# Patient Record
Sex: Female | Born: 1950 | Race: Black or African American | Hispanic: No | Marital: Married | Smoking: Never smoker
Health system: Southern US, Community
[De-identification: ages and names within clinical notes are randomized; demographics above are authoritative.]

## PROBLEM LIST (undated history)

## (undated) DIAGNOSIS — I10 Essential (primary) hypertension: Secondary | ICD-10-CM

## (undated) DIAGNOSIS — F419 Anxiety disorder, unspecified: Secondary | ICD-10-CM

## (undated) DIAGNOSIS — R011 Cardiac murmur, unspecified: Secondary | ICD-10-CM

## (undated) DIAGNOSIS — H269 Unspecified cataract: Secondary | ICD-10-CM

## (undated) DIAGNOSIS — M1990 Unspecified osteoarthritis, unspecified site: Secondary | ICD-10-CM

## (undated) DIAGNOSIS — T7840XA Allergy, unspecified, initial encounter: Secondary | ICD-10-CM

## (undated) HISTORY — DX: Unspecified osteoarthritis, unspecified site: M19.90

## (undated) HISTORY — DX: Allergy, unspecified, initial encounter: T78.40XA

## (undated) HISTORY — DX: Unspecified cataract: H26.9

## (undated) HISTORY — DX: Cardiac murmur, unspecified: R01.1

## (undated) HISTORY — PX: HAND SURGERY: SHX662

---

## 1999-10-07 ENCOUNTER — Ambulatory Visit (HOSPITAL_COMMUNITY): Admission: RE | Admit: 1999-10-07 | Discharge: 1999-10-08 | Payer: Self-pay | Admitting: Cardiovascular Disease

## 2000-01-04 ENCOUNTER — Other Ambulatory Visit: Admission: RE | Admit: 2000-01-04 | Discharge: 2000-01-04 | Payer: Self-pay | Admitting: Gynecology

## 2001-01-08 ENCOUNTER — Other Ambulatory Visit: Admission: RE | Admit: 2001-01-08 | Discharge: 2001-01-08 | Payer: Self-pay | Admitting: Gynecology

## 2001-02-14 ENCOUNTER — Encounter: Admission: RE | Admit: 2001-02-14 | Discharge: 2001-02-14 | Payer: Self-pay | Admitting: Internal Medicine

## 2001-02-14 ENCOUNTER — Encounter: Payer: Self-pay | Admitting: Internal Medicine

## 2002-02-15 ENCOUNTER — Other Ambulatory Visit: Admission: RE | Admit: 2002-02-15 | Discharge: 2002-02-15 | Payer: Self-pay | Admitting: Gynecology

## 2002-02-21 ENCOUNTER — Encounter: Payer: Self-pay | Admitting: Internal Medicine

## 2002-02-21 ENCOUNTER — Encounter: Admission: RE | Admit: 2002-02-21 | Discharge: 2002-02-21 | Payer: Self-pay | Admitting: Internal Medicine

## 2002-03-01 ENCOUNTER — Ambulatory Visit (HOSPITAL_COMMUNITY): Admission: RE | Admit: 2002-03-01 | Discharge: 2002-03-01 | Payer: Self-pay | Admitting: Gastroenterology

## 2002-03-15 ENCOUNTER — Other Ambulatory Visit: Admission: RE | Admit: 2002-03-15 | Discharge: 2002-03-15 | Payer: Self-pay | Admitting: *Deleted

## 2002-11-01 ENCOUNTER — Encounter: Admission: RE | Admit: 2002-11-01 | Discharge: 2002-11-01 | Payer: Self-pay | Admitting: *Deleted

## 2003-02-17 ENCOUNTER — Other Ambulatory Visit: Admission: RE | Admit: 2003-02-17 | Discharge: 2003-02-17 | Payer: Self-pay | Admitting: Gynecology

## 2004-01-09 ENCOUNTER — Encounter: Admission: RE | Admit: 2004-01-09 | Discharge: 2004-01-09 | Payer: Self-pay | Admitting: Internal Medicine

## 2004-03-10 ENCOUNTER — Other Ambulatory Visit: Admission: RE | Admit: 2004-03-10 | Discharge: 2004-03-10 | Payer: Self-pay | Admitting: Gynecology

## 2005-05-18 ENCOUNTER — Other Ambulatory Visit: Admission: RE | Admit: 2005-05-18 | Discharge: 2005-05-18 | Payer: Self-pay | Admitting: Gynecology

## 2006-05-24 ENCOUNTER — Other Ambulatory Visit: Admission: RE | Admit: 2006-05-24 | Discharge: 2006-05-24 | Payer: Self-pay | Admitting: Gynecology

## 2006-06-13 ENCOUNTER — Encounter: Admission: RE | Admit: 2006-06-13 | Discharge: 2006-06-13 | Payer: Self-pay | Admitting: Internal Medicine

## 2006-06-29 ENCOUNTER — Ambulatory Visit (HOSPITAL_BASED_OUTPATIENT_CLINIC_OR_DEPARTMENT_OTHER): Admission: RE | Admit: 2006-06-29 | Discharge: 2006-06-29 | Payer: Self-pay | Admitting: Gynecology

## 2006-06-29 ENCOUNTER — Encounter (INDEPENDENT_AMBULATORY_CARE_PROVIDER_SITE_OTHER): Payer: Self-pay | Admitting: Specialist

## 2007-08-15 ENCOUNTER — Encounter: Admission: RE | Admit: 2007-08-15 | Discharge: 2007-08-15 | Payer: Self-pay | Admitting: Internal Medicine

## 2010-11-12 ENCOUNTER — Other Ambulatory Visit: Payer: Self-pay | Admitting: Gynecology

## 2010-12-17 NOTE — Cardiovascular Report (Signed)
[place]. [facility]  Patient:    Brittany Collier, Brittany Collier                         MRN: [id number] Proc. Date: 10/07/99 Adm. Date:  [id number] Disc. Date: [id number] Attending:  Berry, Jonathan [place] CC:         [facility], [address] [place]             Cardiac Catheterization Laboratory             Warrick Parisian, M.D.                        Cardiac Catheterization  INDICATIONS:  Brittany Collier is a 60 year old African-American female, patient of Dr. Traci Sermon, referred for evaluation of chest pain.  She apparently had a GXT performed recently which was positive.  A Cardiolite stress test performed in our office revealed mild anterolateral ischemia versus breast attenuation.  Her complaints are chest burning returning with stress but not exertion, occasionally goes into her left arm and is associated with lightheadedness and palpitations.  The patient presents now for diagnostic coronary arteriography.  DESCRIPTION OF PROCEDURE:  The patient was brought to the second floor [facility] in the postabsorptive state.  After she was premedicated with p.o. Valium and IV Versed, her right groin was prepped and shaved in the usual sterile fashion.  Xylocaine 1% was used for local anesthesia. A 6 French sheath was inserted into the right femoral artery using standard Seldinger technique.  A 6 French right and left Judkins diagnostic along with a 6 French pigtail catheter were used for selective coronary angiography, left ventriculography, and distal abdominal aortography.  Omnipaque dye was used for the entirety of the case.   Retrograde, aortic, left ventricular and pullback pressures were recorded.  HEMODYNAMICS: 1. Aortic systolic pressure 111, diastolic pressure 71. 2. Left ventricular systolic pressure 111, diastolic pressure 14.  SELECTIVE CORONARY ANGIOGRAPHY: 1. Left main:  Normal. 2. Left anterior  descending:  The LAD is normal. 3. Left circumflex:  Normal. 4. Ramus intermedius branch:  Normal. 5. Right coronary artery:  Codominant and normal.  LEFT VENTRICULOGRAPHY:  The RAO left ventriculogram was performed using 20 cc of Omnipaque dye, 10 cc per second.  The overall LV ejection fraction was estimated aT greater than 70% with ______ obliteration.  DISTAL ABDOMINAL AORTOGRAPHY: Distal abdominal aortogram was performed using 30 cc of Omnipaque dye, 20 cc per second.  The renal arteries are widely patent.  The  infrarenal abdominal aorta and iliac bifurcation were free of significant atherosclerotic changes.  IMPRESSION:  Brittany Collier has normal coronary arteries, normal left ventricular function.  I suspect the pain is noncardiac, most likely gastrointestinal or stress related.  She had a false but minimally positive Cardiolite stress test.  The sheaths were removed and pressure was held on the groin to achieve hemostasis. The patient left the lab in stable condition.  She will remain in the hospital overnight because of the lateness of the hour and be hydrated.  She will be discharged home in the morning and will follow up with Dr. Norina Buzzard for further evaluation.  Dr. Traci Sermon is notified of these results. DD:  10/07/99 TD:  10/09/99 Job: [id number]

## 2010-12-17 NOTE — Op Note (Signed)
   NAME:  Brittany Collier, Brittany Collier                            ACCOUNT NO.:  [id number]   MEDICAL RECORD NO.:  [id number]                   PATIENT TYPE:  AMB   LOCATION:  ENDO                                 FACILITY:  [facility]   PHYSICIAN:  Charolett Bumpers, M.D.             DATE OF BIRTH:  [birth year]   DATE OF PROCEDURE:  03/01/2002  DATE OF DISCHARGE:  03/01/2002                                 OPERATIVE REPORT   PROCEDURE:  Colonoscopy.   INDICATIONS FOR PROCEDURE:  Ms. Brittany Collier is a 60 year old female born  11-12-2050.  Ms. Brittany Collier continues to have regular menses on a monthly basis.  She  has unexplained iron-deficiency anemia and was referred for screening  colonoscopy with polypectomy to prevent colon cancer.   I discussed with Ms. Brittany Collier the complications associated with colonoscopy and  polypectomy, including a 15 per thousand risk of bleeding and four per  thousand risk of colon perforation requiring surgical repair.  Ms. Brittany Collier has  signed the permit.   ENDOSCOPIST:  Charolett Bumpers, M.D.   PREMEDICATION:  Versed 10 mg, fentanyl 50 mcg.   SCOPE:  Olympus pediatric colonoscopy.   DESCRIPTION OF PROCEDURE:  After obtaining informed consent, Ms. Brittany Collier was  placed in the left lateral decubitus position.  I administered intravenous  fentanyl and intravenous Versed to achieve conscious sedation for the  procedure.  The patient's blood pressure, oxygen saturation, and cardiac  rhythm were monitored throughout the procedure and documented in the medical  record.   Anal inspection was normal.  Digital rectal exam was normal.  The Olympus  pediatric video colonoscope was introduced into the rectum and advanced to  the cecum.  Colonic preparation for the exam today was excellent.  Ms. Brittany Collier  received the Miralax-Gatorade colonic lavage prep.   Rectum normal.   Sigmoid colon and descending colon normal.   Splenic flexure normal.   Transverse colon normal.   Hepatic flexure normal.   Ascending colon normal.   Cecum and ileocecal valve  normal.    ASSESSMENT:  Normal proctocolonoscopy to the cecum.  No endoscopic evidence  for the presence of colorectal neoplasia.                                                 Charolett Bumpers, M.D.    MKJ/MEDQ  D:  03/01/2002  T:  03/06/2002  Job:  [id number]   cc:   Georgann Housekeeper, M.D.

## 2010-12-17 NOTE — Op Note (Signed)
NAME:  Brittany Collier, Brittany Collier                  ACCOUNT NO.:  [id number]   MEDICAL RECORD NO.:  [id number]          PATIENT TYPE:  AMB   LOCATION:  [facility]                         FACILITY:  [facility]   PHYSICIAN:  Gretta Cool, M.D. DATE OF BIRTH:  01-19-1951   DATE OF PROCEDURE:  06/29/2006  DATE OF DISCHARGE:                               OPERATIVE REPORT   PREOPERATIVE DIAGNOSIS:  Endometrial polyps.   POSTOPERATIVE DIAGNOSIS:  1. Endometrial polyps.  2. Submucous fibroid.   OPERATION/PROCEDURE:  Resection of endometrial polyps and submucous  fibroid, anterior uterine wall.   SURGEON:  Gretta Cool, M.D.   ANESTHESIA:  IV sedation and paracervical block.   DESCRIPTION OF PROCEDURE:  Under excellent anesthesia as above, with the  patient prepped and draped in the Allen stirrups, with her bladder  drained, a weighted speculum was placed in the vagina posteriorly.  Then  the cervix was then pulled down into view and anesthetized as above.  The cervical canal was then progressively dilated with a series of Pratt  dilators to accommodate the 7 mm resectoscope.  At this point, the  cavity was photographed and a large sessile anterior wall fibroid was  identified.  The fibroid was progressively resected down at least 5 mm  into the myometrium until no further gland openings could be identified.  Once the polyp was resected, a fibroid was noted to have been partially  resected with the resection of the polyp.  That fibroid was completely  enucleated from the capsule and completely removed with manipulation by  the 90-degree loop and by cautery with the loop.  At the end of the  procedure, there was no significant bleeding at reduced pressure.  The  fluid deficit was 90 mL.  No complications.  No other endometrial or  uterine pathology was identified.  The procedure was then terminated  without complications and without ablation of the remainder of the  endometrium at the patient's  request.   At this point, the patient returned to the recovery room in excellent  condition.           ______________________________  Gretta Cool, M.D.     CWL/MEDQ  D:  06/29/2006  T:  06/30/2006  Job:  [id number]   cc:   Georgann Housekeeper, MD  Fax: [phone]

## 2011-08-24 ENCOUNTER — Other Ambulatory Visit: Payer: Self-pay | Admitting: Internal Medicine

## 2011-08-24 DIAGNOSIS — Z1231 Encounter for screening mammogram for malignant neoplasm of breast: Secondary | ICD-10-CM

## 2011-09-05 ENCOUNTER — Ambulatory Visit
Admission: RE | Admit: 2011-09-05 | Discharge: 2011-09-05 | Disposition: A | Discharge status: Home / Self Care | Payer: BC Managed Care – PPO | Source: Ambulatory Visit | Attending: Internal Medicine | Admitting: Internal Medicine

## 2011-09-05 DIAGNOSIS — Z1231 Encounter for screening mammogram for malignant neoplasm of breast: Secondary | ICD-10-CM

## 2011-12-05 ENCOUNTER — Other Ambulatory Visit: Payer: Self-pay | Admitting: Gynecology

## 2012-12-06 ENCOUNTER — Other Ambulatory Visit: Payer: Self-pay | Admitting: Internal Medicine

## 2012-12-06 DIAGNOSIS — Z1231 Encounter for screening mammogram for malignant neoplasm of breast: Secondary | ICD-10-CM

## 2012-12-20 ENCOUNTER — Other Ambulatory Visit: Payer: Self-pay | Admitting: Obstetrics and Gynecology

## 2012-12-20 ENCOUNTER — Other Ambulatory Visit (HOSPITAL_COMMUNITY)
Admission: RE | Admit: 2012-12-20 | Discharge: 2012-12-20 | Disposition: A | Discharge status: Home / Self Care | Payer: BC Managed Care – PPO | Source: Ambulatory Visit | Attending: Obstetrics and Gynecology | Admitting: Obstetrics and Gynecology

## 2012-12-20 DIAGNOSIS — Z01419 Encounter for gynecological examination (general) (routine) without abnormal findings: Secondary | ICD-10-CM | POA: Insufficient documentation

## 2012-12-20 DIAGNOSIS — Z1151 Encounter for screening for human papillomavirus (HPV): Secondary | ICD-10-CM | POA: Insufficient documentation

## 2012-12-31 ENCOUNTER — Other Ambulatory Visit: Payer: Self-pay | Admitting: Obstetrics and Gynecology

## 2013-01-11 ENCOUNTER — Inpatient Hospital Stay: Admission: RE | Admit: 2013-01-11 | Payer: BC Managed Care – PPO | Source: Ambulatory Visit

## 2013-12-31 ENCOUNTER — Other Ambulatory Visit (HOSPITAL_COMMUNITY)
Admission: RE | Admit: 2013-12-31 | Discharge: 2013-12-31 | Disposition: A | Discharge status: Home / Self Care | Payer: BC Managed Care – PPO | Source: Ambulatory Visit | Attending: Obstetrics and Gynecology | Admitting: Obstetrics and Gynecology

## 2013-12-31 ENCOUNTER — Other Ambulatory Visit: Payer: Self-pay | Admitting: Obstetrics and Gynecology

## 2013-12-31 DIAGNOSIS — Z01419 Encounter for gynecological examination (general) (routine) without abnormal findings: Secondary | ICD-10-CM | POA: Insufficient documentation

## 2014-01-02 ENCOUNTER — Other Ambulatory Visit: Payer: Self-pay

## 2014-01-02 DIAGNOSIS — Z1231 Encounter for screening mammogram for malignant neoplasm of breast: Secondary | ICD-10-CM

## 2014-01-03 LAB — CYTOLOGY - PAP

## 2014-01-20 ENCOUNTER — Ambulatory Visit
Admission: RE | Admit: 2014-01-20 | Discharge: 2014-01-20 | Disposition: A | Discharge status: Home / Self Care | Payer: BC Managed Care – PPO | Source: Ambulatory Visit

## 2014-01-20 DIAGNOSIS — Z1231 Encounter for screening mammogram for malignant neoplasm of breast: Secondary | ICD-10-CM

## 2014-11-07 ENCOUNTER — Emergency Department (HOSPITAL_COMMUNITY)
Admission: EM | Admit: 2014-11-07 | Discharge: 2014-11-07 | Disposition: A | Discharge status: Home / Self Care | Payer: Medicare Other | Attending: Emergency Medicine | Admitting: Emergency Medicine

## 2014-11-07 ENCOUNTER — Encounter (HOSPITAL_COMMUNITY): Payer: Self-pay | Admitting: Emergency Medicine

## 2014-11-07 ENCOUNTER — Emergency Department (HOSPITAL_COMMUNITY): Payer: Medicare Other

## 2014-11-07 DIAGNOSIS — F1310 Sedative, hypnotic or anxiolytic abuse, uncomplicated: Secondary | ICD-10-CM | POA: Insufficient documentation

## 2014-11-07 DIAGNOSIS — Z79899 Other long term (current) drug therapy: Secondary | ICD-10-CM | POA: Insufficient documentation

## 2014-11-07 DIAGNOSIS — I10 Essential (primary) hypertension: Secondary | ICD-10-CM | POA: Insufficient documentation

## 2014-11-07 DIAGNOSIS — R202 Paresthesia of skin: Secondary | ICD-10-CM | POA: Diagnosis not present

## 2014-11-07 DIAGNOSIS — E162 Hypoglycemia, unspecified: Secondary | ICD-10-CM | POA: Insufficient documentation

## 2014-11-07 DIAGNOSIS — F419 Anxiety disorder, unspecified: Secondary | ICD-10-CM | POA: Insufficient documentation

## 2014-11-07 DIAGNOSIS — R200 Anesthesia of skin: Secondary | ICD-10-CM | POA: Diagnosis present

## 2014-11-07 HISTORY — DX: Essential (primary) hypertension: I10

## 2014-11-07 HISTORY — DX: Anxiety disorder, unspecified: F41.9

## 2014-11-07 LAB — DIFFERENTIAL
BASOS PCT: 0 % (ref 0–1)
Basophils Absolute: 0 10*3/uL (ref 0.0–0.1)
Eosinophils Absolute: 0.1 10*3/uL (ref 0.0–0.7)
Eosinophils Relative: 1 % (ref 0–5)
LYMPHS ABS: 1.8 10*3/uL (ref 0.7–4.0)
Lymphocytes Relative: 24 % (ref 12–46)
MONOS PCT: 6 % (ref 3–12)
Monocytes Absolute: 0.5 10*3/uL (ref 0.1–1.0)
NEUTROS ABS: 5.1 10*3/uL (ref 1.7–7.7)
NEUTROS PCT: 69 % (ref 43–77)

## 2014-11-07 LAB — I-STAT CHEM 8, ED
BUN: 19 mg/dL (ref 6–23)
Calcium, Ion: 1.17 mmol/L (ref 1.13–1.30)
Chloride: 93 mmol/L — ABNORMAL LOW (ref 96–112)
Creatinine, Ser: 1.2 mg/dL — ABNORMAL HIGH (ref 0.50–1.10)
GLUCOSE: 100 mg/dL — AB (ref 70–99)
HEMATOCRIT: 43 % (ref 36.0–46.0)
HEMOGLOBIN: 14.6 g/dL (ref 12.0–15.0)
POTASSIUM: 3 mmol/L — AB (ref 3.5–5.1)
Sodium: 130 mmol/L — ABNORMAL LOW (ref 135–145)
TCO2: 21 mmol/L (ref 0–100)

## 2014-11-07 LAB — URINALYSIS, ROUTINE W REFLEX MICROSCOPIC
BILIRUBIN URINE: NEGATIVE
GLUCOSE, UA: 100 mg/dL — AB
HGB URINE DIPSTICK: NEGATIVE
KETONES UR: 15 mg/dL — AB
LEUKOCYTES UA: NEGATIVE
NITRITE: NEGATIVE
PH: 7 (ref 5.0–8.0)
Protein, ur: NEGATIVE mg/dL
SPECIFIC GRAVITY, URINE: 1.008 (ref 1.005–1.030)
UROBILINOGEN UA: 0.2 mg/dL (ref 0.0–1.0)

## 2014-11-07 LAB — COMPREHENSIVE METABOLIC PANEL
ALK PHOS: 56 U/L (ref 39–117)
ALT: 24 U/L (ref 0–35)
ANION GAP: 15 (ref 5–15)
AST: 40 U/L — ABNORMAL HIGH (ref 0–37)
Albumin: 4.3 g/dL (ref 3.5–5.2)
BUN: 17 mg/dL (ref 6–23)
CALCIUM: 9.7 mg/dL (ref 8.4–10.5)
CO2: 23 mmol/L (ref 19–32)
CREATININE: 1.23 mg/dL — AB (ref 0.50–1.10)
Chloride: 91 mmol/L — ABNORMAL LOW (ref 96–112)
GFR calc non Af Amer: 45 mL/min — ABNORMAL LOW (ref >90)
GFR, EST AFRICAN AMERICAN: 52 mL/min — AB (ref >90)
GLUCOSE: 101 mg/dL — AB (ref 70–99)
Potassium: 3 mmol/L — ABNORMAL LOW (ref 3.5–5.1)
Sodium: 129 mmol/L — ABNORMAL LOW (ref 135–145)
TOTAL PROTEIN: 7.4 g/dL (ref 6.0–8.3)
Total Bilirubin: 1.1 mg/dL (ref 0.3–1.2)

## 2014-11-07 LAB — RAPID URINE DRUG SCREEN, HOSP PERFORMED
Amphetamines: NOT DETECTED
BARBITURATES: NOT DETECTED
Benzodiazepines: POSITIVE — AB
COCAINE: NOT DETECTED
Opiates: NOT DETECTED
Tetrahydrocannabinol: NOT DETECTED

## 2014-11-07 LAB — CBC
HEMATOCRIT: 36.9 % (ref 36.0–46.0)
HEMOGLOBIN: 13.4 g/dL (ref 12.0–15.0)
MCH: 32.8 pg (ref 26.0–34.0)
MCHC: 36.3 g/dL — AB (ref 30.0–36.0)
MCV: 90.4 fL (ref 78.0–100.0)
Platelets: 217 10*3/uL (ref 150–400)
RBC: 4.08 MIL/uL (ref 3.87–5.11)
RDW: 12.9 % (ref 11.5–15.5)
WBC: 7.4 10*3/uL (ref 4.0–10.5)

## 2014-11-07 LAB — PROTIME-INR
INR: 0.95 (ref 0.00–1.49)
PROTHROMBIN TIME: 12.8 s (ref 11.6–15.2)

## 2014-11-07 LAB — CBG MONITORING, ED
GLUCOSE-CAPILLARY: 93 mg/dL (ref 70–99)
Glucose-Capillary: 69 mg/dL — ABNORMAL LOW (ref 70–99)
Glucose-Capillary: 82 mg/dL (ref 70–99)

## 2014-11-07 LAB — I-STAT TROPONIN, ED: TROPONIN I, POC: 0.01 ng/mL (ref 0.00–0.08)

## 2014-11-07 LAB — ETHANOL: Alcohol, Ethyl (B): <5 mg/dL (ref 0–9)

## 2014-11-07 LAB — APTT: APTT: 28 s (ref 24–37)

## 2014-11-07 MED ORDER — DEXTROSE 50 % IV SOLN
INTRAVENOUS | Status: AC
  Administered 2014-11-07: 25 mL via INTRAVENOUS
  Filled 2014-11-07: qty 50

## 2014-11-07 MED ORDER — SODIUM CHLORIDE 0.9 % IV BOLUS (SEPSIS)
1000.0000 mL | Freq: Once | INTRAVENOUS | Status: AC
  Administered 2014-11-07: 1000 mL via INTRAVENOUS

## 2014-11-07 MED ORDER — DEXTROSE 50 % IV SOLN
25.0000 mL | Freq: Once | INTRAVENOUS | Status: AC
Start: 2014-11-07 — End: 2014-11-07
  Administered 2014-11-07: 25 mL via INTRAVENOUS

## 2014-11-07 NOTE — Code Documentation (Signed)
[facility] after developing Lt forearm tingling, facial droop, and slurred speech.  She has recently been taking Clindamycin for a tooth "issue" and has been taking 2tabs q4hrs instead of qid.  On assessment she was found to have only tingling to the Lt forearm & a CBG 69.  She was given 1/2 amp D50 and the symptoms of tingling in her Lt forearm resolved.  NIH1 resolved to NIH 0.  Plan to be managed by EDP.

## 2014-11-07 NOTE — ED Notes (Signed)
CARELINK CALLED @2030 /CODE STROKE ACTIVATED.

## 2014-11-07 NOTE — ED Notes (Signed)
The pt is c/o lt arm numbness for one hour with blurred vision for 30 minutes.  Both hands numb feeling dizzy.  She is taking clindamycin and she thinks this med is causing her symptoms. Both hands and feet are cold

## 2014-11-07 NOTE — Discharge Instructions (Signed)
Take ASA 81 mg daily.   See neurologist if you still have numbness.  Your sugar is low. Please eat and drink normally and stay hydrated.   Follow up with your doctor.   Return to ER if you have worse numbness, weakness, trouble speaking.

## 2014-11-07 NOTE — ED Notes (Signed)
Dr Blinda Leatherwoodpollina saw the pt in triage called code stroke

## 2014-11-07 NOTE — Consult Note (Signed)
Stroke Consult    Chief Complaint: left forearm paresthesias HPI: Brittany Collier is an 64 y.o. female with history of HTN, anxiety presenting with acute onset of left forearm paresthesias described as a tingling sensation. Symptoms started at 1930 and is localized to left forearm from elbow to wrist, in no dermatomal distribution. Denies any weakness, no speech difficulties. Does note some dizziness and blurred vision. She notes that she recently started clindamycin and may have taken too many. After counting her pills it appears she has been taking an extra 2 tablets per day.  CT head imaging reviewed and overall. CBG 69 in the ED. Given 1/2 amp of D50 with resolution of LUE paresthesias.   Date last known well: 11/07/2014 Time last known well: 1930 tPA Given: no, NIH of 1, symptoms not fully consistent with a stroke  Past Medical History  Diagnosis Date  . Hypertension   . Anxiety     History reviewed. No pertinent past surgical history.  No family history on file. Social History:  has no tobacco, alcohol, and drug history on file.  Allergies: No Known Allergies   (Not in a hospital admission)  ROS: Out of a complete 14 system review, the patient complains of only the following symptoms, and all other reviewed systems are negative. +paresthesias, dizziness  Physical Examination: Filed Vitals:   11/07/14 2028  BP: 179/94  Pulse: 66  Temp: 97.9 F (36.6 C)  Resp: 18   Physical Exam  Constitutional: He appears well-developed and well-nourished.  Psych: Affect appropriate to situation Eyes: No scleral injection HENT: No OP obstrucion Head: Normocephalic.  Cardiovascular: Normal rate and regular rhythm.  Respiratory: Effort normal and breath sounds normal.  GI: Soft. Bowel sounds are normal. No distension. There is no tenderness.  Skin: WDI   Neurologic Examination: Mental Status: Alert, oriented, thought content appropriate.  Speech fluent without evidence of  aphasia.  Able to follow 3 step commands without difficulty. Cranial Nerves: II: funduscopic exam wnl bilaterally, visual fields grossly normal, pupils equal, round, reactive to light and accommodation III,IV, VI: ptosis not present, extra-ocular motions intact bilaterally V,VII: smile symmetric, facial light touch sensation normal bilaterally VIII: hearing normal bilaterally IX,X: gag reflex present XI: trapezius strength/neck flexion strength normal bilaterally XII: tongue strength normal  Motor: Right : Upper extremity    Left:     Upper extremity 5/5 deltoid       5/5 deltoid 5/5 biceps      5/5 biceps  5/5 triceps      5/5 triceps 5/5 hand grip      5/5 hand grip  Lower extremity     Lower extremity 5/5 hip flexor      5/5 hip flexor 5/5 quadricep      5/5 quadriceps  5/5 hamstrings     5/5 hamstrings 5/5 plantar flexion       5/5 plantar flexion 5/5 plantar extension     5/5 plantar extension Tone and bulk:normal tone throughout; no atrophy noted Sensory: Pinprick and light touch intact throughout, bilaterally Deep Tendon Reflexes: 2+ and symmetric throughout Plantars: Right: downgoing   Left: downgoing Cerebellar: normal finger-to-nose,  and normal heel-to-shin test Gait: deferred  Laboratory Studies:   Basic Metabolic Panel:  Recent Labs Lab 11/07/14 2044  NA 130*  K 3.0*  CL 93*  GLUCOSE 100*  BUN 19  CREATININE 1.20*    Liver Function Tests: No results for input(s): AST, ALT, ALKPHOS, BILITOT, PROT, ALBUMIN in the last 168 hours.  No results for input(s): LIPASE, AMYLASE in the last 168 hours. No results for input(s): AMMONIA in the last 168 hours.  CBC:  Recent Labs Lab 11/07/14 2044  HGB 14.6  HCT 43.0    Cardiac Enzymes: No results for input(s): CKTOTAL, CKMB, CKMBINDEX, TROPONINI in the last 168 hours.  BNP: Invalid input(s): POCBNP  CBG:  Recent Labs Lab 11/07/14 2046  GLUCAP 69*    Microbiology: No results found for this or any  previous visit.  Coagulation Studies: No results for input(s): LABPROT, INR in the last 72 hours.  Urinalysis: No results for input(s): COLORURINE, LABSPEC, PHURINE, GLUCOSEU, HGBUR, BILIRUBINUR, KETONESUR, PROTEINUR, UROBILINOGEN, NITRITE, LEUKOCYTESUR in the last 168 hours.  Invalid input(s): APPERANCEUR  Lipid Panel:  No results found for: CHOL, TRIG, HDL, CHOLHDL, VLDL, LDLCALC  HgbA1C: No results found for: HGBA1C  Urine Drug Screen:  No results found for: LABOPIA, COCAINSCRNUR, LABBENZ, AMPHETMU, THCU, LABBARB  Alcohol Level: No results for input(s): ETH in the last 168 hours.  Other results:  Imaging: Ct Head Wo Contrast  11/07/2014   CLINICAL DATA:  Code stroke. Left arm numbness. Blurred vision. Numbness in both hands. Dizziness.  EXAM: CT HEAD WITHOUT CONTRAST  TECHNIQUE: Contiguous axial images were obtained from the base of the skull through the vertex without intravenous contrast.  COMPARISON:  Grid  FINDINGS: No acute cortical infarct, hemorrhage, or mass lesion is present. The ventricles are of normal size. No significant extra-axial fluid collection is evident. The paranasal sinuses and mastoid air cells are clear. The calvarium is intact.  IMPRESSION: Negative CT of the head.  These results were called by telephone at the time of interpretation on 11/07/2014 at 8:45 pm to Dr. Hosie PoissonSumner, who verbally acknowledged these results.   Electronically Signed   By: Marin Robertshristopher  Mattern M.D.   On: 11/07/2014 20:45    Assessment: 64 y.o. female with history of HTN presenting for acute onset of left forearm paresthesias. History pertinent for patient overmedicating on her clindamycin. CT head unremarkable. CBG initially 69 in ED, symptoms resolved with 1/2 amp of D50. Initial NIHSS of 1. No tPA given for mildness of symptoms.   -no further neurological workup indicated at this time as suspect symptoms related to low CBG or polypharmacy.  -Is within tPA window until midnight tonight, will  reassess if symptoms return -suggest starting daily ASA 81mg     Elspeth Choeter Keyaria Lawson, DO [facility] [phone]  If 7pm- 7am, please page neurology on call as listed in AMION. 11/07/2014, 8:55 PM

## 2014-11-07 NOTE — ED Notes (Signed)
The husband reports that  The pt has anxiety and takes med for that.  She also takes a water pill

## 2014-11-07 NOTE — ED Provider Notes (Signed)
CSN: [id number]     Arrival date & time 11/07/14  2014 History   First MD Initiated Contact with Patient 11/07/14 2047     Chief Complaint  Patient presents with  . Numbness    @EDPCLEARED @ (Consider location/radiation/quality/duration/timing/severity/associated sxs/prior Treatment) The history is provided by the patient.  Brittany Collier is a 64 y.o. female hx of HTN, anxiety here with L arm parethesia. Patient has history of anxiety has been taking clindamycin for her toothache. She did take an extra dose. She started having left arm paresthesias around 7 PM today. Denies any weakness or trouble speaking. No history of stroke in the past and no chest pain.    Past Medical History  Diagnosis Date  . Hypertension   . Anxiety    History reviewed. No pertinent past surgical history. No family history on file. History  Substance Use Topics  . Smoking status: Never Smoker   . Smokeless tobacco: Not on file  . Alcohol Use: Yes   OB History    No data available     Review of Systems  Musculoskeletal:       L arm paresthesia   All other systems reviewed and are negative.     Allergies  Review of patient's allergies indicates no known allergies.  Home Medications   Prior to Admission medications   Medication Sig Start Date End Date Taking? Authorizing Provider  clindamycin (CLEOCIN) 150 MG capsule Take 300 mg by mouth 4 (four) times daily. 11/04/14  Yes Historical Provider, MD  hydrochlorothiazide (HYDRODIURIL) 25 MG tablet Take 25 mg by mouth daily. 09/17/14  Yes Historical Provider, MD   BP 138/83 mmHg  Pulse 55  Temp(Src) 97.9 F (36.6 C)  Resp 16  Ht 5\' 4"  (1.626 m)  Wt 150 lb (68.04 kg)  BMI 25.73 kg/m2  SpO2 100% Physical Exam  Constitutional: She is oriented to person, place, and time.  Anxious   HENT:  Head: Normocephalic.  Mouth/Throat: Oropharynx is clear and moist.  Eyes: Conjunctivae are normal. Pupils are equal, round, and reactive to light.  Neck:  Normal range of motion. Neck supple.  Cardiovascular: Normal rate and regular rhythm.   Pulmonary/Chest: Effort normal and breath sounds normal. No respiratory distress. She has no wheezes. She has no rales.  Abdominal: Soft. Bowel sounds are normal. She exhibits no distension. There is no tenderness. There is no rebound.  Musculoskeletal: Normal range of motion. She exhibits no edema or tenderness.  Neurological: She is alert and oriented to person, place, and time. No cranial nerve deficit. Coordination normal.  CN 2-12 intact. Nl finger to nose. Nl strength throughout. ? Slightly dec sensation L forearm.   Skin: Skin is warm and dry.  Psychiatric: She has a normal mood and affect. Her behavior is normal. Judgment and thought content normal.  Nursing note and vitals reviewed.   ED Course  Procedures (including critical care time) Labs Review Labs Reviewed  CBC - Abnormal; Notable for the following:    MCHC 36.3 (*)    All other components within normal limits  COMPREHENSIVE METABOLIC PANEL - Abnormal; Notable for the following:    Sodium 129 (*)    Potassium 3.0 (*)    Chloride 91 (*)    Glucose, Bld 101 (*)    Creatinine, Ser 1.23 (*)    AST 40 (*)    GFR calc non Af Amer 45 (*)    GFR calc Af Amer 52 (*)    All other components  within normal limits  URINE RAPID DRUG SCREEN (HOSP PERFORMED) - Abnormal; Notable for the following:    Benzodiazepines POSITIVE (*)    All other components within normal limits  URINALYSIS, ROUTINE W REFLEX MICROSCOPIC - Abnormal; Notable for the following:    Glucose, UA 100 (*)    Ketones, ur 15 (*)    All other components within normal limits  I-STAT CHEM 8, ED - Abnormal; Notable for the following:    Sodium 130 (*)    Potassium 3.0 (*)    Chloride 93 (*)    Creatinine, Ser 1.20 (*)    Glucose, Bld 100 (*)    All other components within normal limits  CBG MONITORING, ED - Abnormal; Notable for the following:    Glucose-Capillary 69 (*)     All other components within normal limits  ETHANOL  PROTIME-INR  APTT  DIFFERENTIAL  I-STAT TROPOININ, ED  I-STAT TROPOININ, ED  CBG MONITORING, ED  CBG MONITORING, ED    Imaging Review Ct Head Wo Contrast  11/07/2014   CLINICAL DATA:  Code stroke. Left arm numbness. Blurred vision. Numbness in both hands. Dizziness.  EXAM: CT HEAD WITHOUT CONTRAST  TECHNIQUE: Contiguous axial images were obtained from the base of the skull through the vertex without intravenous contrast.  COMPARISON:  Grid  FINDINGS: No acute cortical infarct, hemorrhage, or mass lesion is present. The ventricles are of normal size. No significant extra-axial fluid collection is evident. The paranasal sinuses and mastoid air cells are clear. The calvarium is intact.  IMPRESSION: Negative CT of the head.  These results were called by telephone at the time of interpretation on 11/07/2014 at 8:45 pm to Dr. Hosie Poisson, who verbally acknowledged these results.   Electronically Signed   By: Marin Roberts M.D.   On: 11/07/2014 20:45     EKG Interpretation   Date/Time:  Friday November 07 2014 20:48:29 EDT Ventricular Rate:  68 PR Interval:  197 QRS Duration: 81 QT Interval:  424 QTC Calculation: 451 R Axis:   -29 Text Interpretation:  Sinus rhythm Consider left ventricular hypertrophy  Baseline wander in lead(s) V1 No previous ECGs available Confirmed by Dacen Frayre   MD, Kile Kabler ([id number]) on 11/07/2014 9:24:40 PM      MDM   Final diagnoses:  None    Brittany Collier is a 64 y.o. female here with L arm numbness. I doubt stroke. Numbness only in the forearm and no other neurologic finding. She is hypoglycemia to 67 but didn't eat any dinner. After observing in the ED for several hours, CBG stable. Neuro saw patient since code stroke activated in triage. Doesn't think she has stroke and doesn't want stroke workup. Will dc home.      Richardean Canal, MD 11/07/14 [phone]

## 2015-01-30 ENCOUNTER — Other Ambulatory Visit: Payer: Self-pay | Admitting: Internal Medicine

## 2015-01-30 DIAGNOSIS — Z1231 Encounter for screening mammogram for malignant neoplasm of breast: Secondary | ICD-10-CM

## 2015-02-04 ENCOUNTER — Other Ambulatory Visit: Payer: Self-pay | Admitting: Obstetrics and Gynecology

## 2015-02-04 ENCOUNTER — Other Ambulatory Visit (HOSPITAL_COMMUNITY)
Admission: RE | Admit: 2015-02-04 | Discharge: 2015-02-04 | Disposition: A | Discharge status: Home / Self Care | Payer: Medicare Other | Source: Ambulatory Visit | Attending: Obstetrics and Gynecology | Admitting: Obstetrics and Gynecology

## 2015-02-04 DIAGNOSIS — Z124 Encounter for screening for malignant neoplasm of cervix: Secondary | ICD-10-CM | POA: Insufficient documentation

## 2015-02-05 LAB — CYTOLOGY - PAP

## 2015-02-12 ENCOUNTER — Ambulatory Visit: Payer: Medicare Other

## 2015-03-06 ENCOUNTER — Ambulatory Visit: Payer: Medicare Other

## 2016-02-16 ENCOUNTER — Ambulatory Visit
Admission: RE | Admit: 2016-02-16 | Discharge: 2016-02-16 | Disposition: A | Discharge status: Home / Self Care | Payer: Medicare Other | Source: Ambulatory Visit | Attending: Internal Medicine | Admitting: Internal Medicine

## 2016-02-16 DIAGNOSIS — Z1231 Encounter for screening mammogram for malignant neoplasm of breast: Secondary | ICD-10-CM

## 2017-01-27 ENCOUNTER — Other Ambulatory Visit: Payer: Self-pay

## 2017-01-27 ENCOUNTER — Emergency Department (HOSPITAL_COMMUNITY): Payer: Medicare Other

## 2017-01-27 ENCOUNTER — Encounter (HOSPITAL_COMMUNITY): Payer: Self-pay

## 2017-01-27 ENCOUNTER — Observation Stay (HOSPITAL_COMMUNITY)
Admission: EM | Admit: 2017-01-27 | Discharge: 2017-01-28 | Disposition: A | Discharge status: Home / Self Care | Payer: Medicare Other | Attending: Family Medicine | Admitting: Family Medicine

## 2017-01-27 DIAGNOSIS — R51 Headache: Secondary | ICD-10-CM | POA: Insufficient documentation

## 2017-01-27 DIAGNOSIS — R0789 Other chest pain: Secondary | ICD-10-CM | POA: Diagnosis present

## 2017-01-27 DIAGNOSIS — R079 Chest pain, unspecified: Secondary | ICD-10-CM | POA: Diagnosis present

## 2017-01-27 DIAGNOSIS — Z79899 Other long term (current) drug therapy: Secondary | ICD-10-CM | POA: Diagnosis not present

## 2017-01-27 DIAGNOSIS — I10 Essential (primary) hypertension: Secondary | ICD-10-CM | POA: Diagnosis not present

## 2017-01-27 DIAGNOSIS — I161 Hypertensive emergency: Principal | ICD-10-CM | POA: Insufficient documentation

## 2017-01-27 LAB — CBC
HEMATOCRIT: 40.1 % (ref 36.0–46.0)
Hemoglobin: 14 g/dL (ref 12.0–15.0)
MCH: 32.8 pg (ref 26.0–34.0)
MCHC: 34.9 g/dL (ref 30.0–36.0)
MCV: 93.9 fL (ref 78.0–100.0)
Platelets: 177 10*3/uL (ref 150–400)
RBC: 4.27 MIL/uL (ref 3.87–5.11)
RDW: 13.9 % (ref 11.5–15.5)
WBC: 4.1 10*3/uL (ref 4.0–10.5)

## 2017-01-27 LAB — BASIC METABOLIC PANEL
Anion gap: 8 (ref 5–15)
BUN: 17 mg/dL (ref 6–20)
CHLORIDE: 98 mmol/L — AB (ref 101–111)
CO2: 27 mmol/L (ref 22–32)
Calcium: 9.9 mg/dL (ref 8.9–10.3)
Creatinine, Ser: 1.12 mg/dL — ABNORMAL HIGH (ref 0.44–1.00)
GFR calc Af Amer: 58 mL/min — ABNORMAL LOW (ref >60)
GFR calc non Af Amer: 50 mL/min — ABNORMAL LOW (ref >60)
GLUCOSE: 103 mg/dL — AB (ref 65–99)
Potassium: 3.5 mmol/L (ref 3.5–5.1)
SODIUM: 133 mmol/L — AB (ref 135–145)

## 2017-01-27 LAB — I-STAT TROPONIN, ED
TROPONIN I, POC: 0.01 ng/mL (ref 0.00–0.08)
Troponin i, poc: 0 ng/mL (ref 0.00–0.08)

## 2017-01-27 LAB — CBG MONITORING, ED: Glucose-Capillary: 90 mg/dL (ref 65–99)

## 2017-01-27 MED ORDER — ONDANSETRON HCL 4 MG/2ML IJ SOLN
4.0000 mg | Freq: Once | INTRAMUSCULAR | Status: AC
  Administered 2017-01-27: 4 mg via INTRAVENOUS

## 2017-01-27 MED ORDER — IOPAMIDOL (ISOVUE-370) INJECTION 76%
INTRAVENOUS | Status: AC
  Administered 2017-01-27: 100 mL
  Filled 2017-01-27: qty 100

## 2017-01-27 MED ORDER — ONDANSETRON HCL 4 MG/2ML IJ SOLN
INTRAMUSCULAR | Status: AC
  Filled 2017-01-27: qty 2

## 2017-01-27 MED ORDER — HYDRALAZINE HCL 20 MG/ML IJ SOLN
10.0000 mg | Freq: Once | INTRAMUSCULAR | Status: AC
  Administered 2017-01-27: 10 mg via INTRAVENOUS
  Filled 2017-01-27: qty 1

## 2017-01-27 NOTE — ED Notes (Signed)
ED Resident at bedside.

## 2017-01-27 NOTE — ED Notes (Signed)
Informed pt that I would wait a little bit after nausea medication and then get her a [place] sandwich.

## 2017-01-27 NOTE — H&P (Signed)
Family Medicine [facility] Admission History and Physical Service Pager: [phone]  Patient name: Brittany Collier Medical record number: [id number] Date of birth: [birth year] Age: 66 y.o. Gender: female  Primary Care Provider: No primary care provider on file. Consultants: none Code Status: full  Chief Complaint: dizziness, fatigue, confusion  Assessment and Plan: Brittany Collier is a 66 y.o. female presenting with dizziness, fatigue, confusion. PMH is significant for anxiety and HTN.   Pre-Syncope- Multiple complaints of dizziness, fatigue, and confusion in addition to chest discomfort. Unclear if these are related. Unclear etiology for patient's complaints, could have a psychogenic component to them as patient endorses long history of anxiety with recent stressors. Patient hypertensive in ED which could be contributing to presentation. Glucose normal in ED. CT head negative, making intracranial cause of her dizziness and confusion less likely. CXR negative, CTA negative for PE. Could be secondary to dehydration, given that it occurred after working out at the gym. No new medications that could be contributing. Concern for valvular abnormality such as aortic stenosis, given her III/VI systolic heart murmur heard loudest in the RUSB. -place in observation, attending Dr. Deirdre Priest -monitor on telemetry -vitals per floor -orthostatic vitals -gentle hydration overnight -repeat AM CBC and BMP -obtain echocardiogram given her heart murmur -consider carotid dopplers, although no carotid bruits appreciated -check TSH -check fasting CBGs each morning, given patient's history of hypoglycemia -treat anxiety, chest discomfort, and HTN as below  Chest discomfort- Concern for ACS given EKG with new T wave inversions in II, III, avF, V4-V6. Trops negative x 2. Heart score 5. Could be musculoskeletal in etiology, although discomfort was not reproducible on palpation of the chest. -trend  troponins -AM EKG -patient may benefit from outpatient stress test  Hypertension- BP elevated in ED 187/105, 189/107, 185/92, required 2 doses of IV hydralazine 10 mg each with adequate response of BP but made patient feel dizzy and "spacy". Hypertensive urgency could be contributing to patient's vague presenting symptoms. Takes 25 mg HCTZ at home along with potassium supplement. -monitor BP closely -continue home HCTZ, k-dur -hydralazine PRN for systolic > 180 and diastolic > 110 -patient will likely need another BP agent added this stay  Anxiety- Per patient, long history of panic attacks. She is on xanax XR 1 mg BID and 0.5 BID on home med list. She takes Xanax 1mg  XR and 0.5mg  in the morning, then takes Xanax XR again around 7pm, will take last 0.5 mg prior to bed for sleep. Reports significant stress in her life recently around her daughter's divorce and custody issues of grandchildren.  -continue home regimen -patient would benefit from outpatient behavioral health consultation and therapy -would suggest SSRI in addition to home regimen  FEN/GI: heart healthy diet Prophylaxis: lovenox  Disposition: observe on telemetry for ACS rule out  History of Present Illness:  Brittany Collier is a 66 y.o. female presenting with dizziness, fatigue, confusion, intermittent headache and an episode of chest pain.   On Tuesday, patient went to the gym and ran on the treadmill and lift weights. When she got home, she was very confused. She also had headache over her temples, upset stomach, fatigue, left arm tingling, and decreased appetite. She states she has felt this way before and came to the ED. She had low sugar and was given sugar in the ED. On Tuesday when she was feeling bad, she drank some gatorade, which made her feel better. On Wednesday, she continued to feel dizzy, off balance, and  tired. On Thursday night, her left arm was tingling and her posterior neck hurt. She again had a headache which is  unusual for her. She endorsed chest pain left sided that radiated to her left shoulder as well as back pain around her shoulder blade. She did check her BP and it was normal. This morning, she had a hair appointment at the salon. She still had low energy and felt dizzy. She has not been eating well over the past couple of days. She has only been eating breakfast. She normally eats "1.5 meals per day". She describes the dizziness as "everything feels spacy and a long way away, like she is moving through space". She has checked her blood pressures a few times at home over the last few months. The highest BP she has seen was "150 over something". Per husband, patient seems like her normal self in the ED now.  In the ED, she received Hydralazine IV x 2. After the second injection, she started feeling very dizzy and nauseated. She was given Zofran.  Patient endorses a long history of anxiety that has recently been worse. She takes Xanax for anxiety. She also runs which provides relief, however for past 2-3 months she has not been getting the therapeutic effect from running. When asked about stressors in her life she reports her daughter is going through a divorce and custody battle that is very upsetting and stressful to the patient.   Review Of Systems: Per HPI with the following additions:   Review of Systems  HENT: Negative for congestion and sore throat.   Eyes: Negative for blurred vision and double vision.  Respiratory: Negative for shortness of breath.   Cardiovascular: Negative for chest pain.  Gastrointestinal: Negative for blood in stool, constipation, diarrhea and melena.  Genitourinary: Negative for dysuria and urgency.  Musculoskeletal: Positive for neck pain. Negative for falls.  Neurological: Positive for dizziness, weakness and headaches. Negative for speech change.    Patient Active Problem List   Diagnosis Date Noted  . Chest pain 01/27/2017    Past Medical History: Past Medical  History:  Diagnosis Date  . Anxiety   . Hypertension     Past Surgical History: History reviewed. No pertinent surgical history.  Social History: Social History  Substance Use Topics  . Smoking status: Never Smoker  . Smokeless tobacco: Not on file  . Alcohol use Yes   Additional social history: Lives at home with husband. Drinks 7 oz of white wine at night. Please also refer to relevant sections of EMR.  Family History: Mother- steroid-induced diabetes, PVD Brother- passed away from stomach cancer  Allergies and Medications: Allergies  Allergen Reactions  . Penicillins     "doesnt remember"   No current facility-administered medications on file prior to encounter.    Current Outpatient Prescriptions on File Prior to Encounter  Medication Sig Dispense Refill  . hydrochlorothiazide (HYDRODIURIL) 25 MG tablet Take 25 mg by mouth daily.  0  . clindamycin (CLEOCIN) 150 MG capsule Take 300 mg by mouth 4 (four) times daily.  0    Objective: BP (!) 141/80 (BP Location: Left Arm)   Pulse 79   Temp 99.7 F (37.6 C) (Oral)   Resp 19   Ht 5\' 4"  (1.626 m)   Wt 151 lb 6.4 oz (68.7 kg)   SpO2 98%   BMI 25.99 kg/m  Exam: General: pleasant lady, appears younger than stated age, laying in bed in NAD Eyes: PERRL, EOMI. No scleral icterus  or conjunctival pallor ENTM: moist mucous membranes, no erythema or discharge in oropharynx, symmetrical palate rise Neck: supple, non-tender, no LAD Cardiovascular: RRR. 3/6 systolic murmur best heard at right upper sternal border. +2 radial and DP pulses. Chest pain not reproducible with palpation of anterior chest wall Respiratory: CTAB. No increased WOB Gastrointestinal: soft, non-tender, non-distended. No organomegaly. +BS MSK: no edema or cyanosis, left shoulder TTP at [facility] joint Derm: no rashes or lesions noted over back, abdomen, extremities  Neuro: CN2-12 in tact, strength 5/5 in upper and lower extremities bilaterally, sensation intact  throughout, symmetrical reflexes at L4 bilaterally, normal finger-to-nose testing, normal heel-to-shin. Psych: reported mood is anxious, appropriate affect  Labs and Imaging: CBC BMET   Recent Labs Lab 01/27/17 1444  WBC 4.1  HGB 14.0  HCT 40.1  PLT 177    Recent Labs Lab 01/27/17 1444  NA 133*  K 3.5  CL 98*  CO2 27  BUN 17  CREATININE 1.12*  GLUCOSE 103*  CALCIUM 9.9     Dg Chest 2 View  Result Date: 01/27/2017 CLINICAL DATA:  Left arm tingling and heaviness. EXAM: CHEST  2 VIEW COMPARISON:  None. FINDINGS: The heart size and mediastinal contours are within normal limits. Both lungs are clear. The visualized skeletal structures are unremarkable. IMPRESSION: No active cardiopulmonary disease. Electronically Signed   By: Gerome Sam III M.D   On: 01/27/2017 15:28   Ct Head Wo Contrast  Result Date: 01/27/2017 CLINICAL DATA:  Dizziness, loss of appetite, headache and numbness in left arm for 3 days. Hypertension. EXAM: CT HEAD WITHOUT CONTRAST TECHNIQUE: Contiguous axial images were obtained from the base of the skull through the vertex without intravenous contrast. COMPARISON:  Head CT dated 11/07/2014. FINDINGS: Brain: Ventricles are normal in size and configuration. There is no mass, hemorrhage, edema or other evidence of acute parenchymal abnormality. No extra-axial hemorrhage. Vascular: No hyperdense vessel or unexpected calcification. Skull: Normal. Negative for fracture or focal lesion. Sinuses/Orbits: No acute finding. Other: None. IMPRESSION: Negative head CT.  No intracranial mass, hemorrhage or edema. Electronically Signed   By: Bary Richard M.D.   On: 01/27/2017 17:02   Ct Angio Chest Aorta W And/or Wo Contrast  Result Date: 01/27/2017 CLINICAL DATA:  Chest pain and hypertension. LEFT arm tingling for 3 days. Assess for dissection. EXAM: CT ANGIOGRAPHY CHEST WITH CONTRAST TECHNIQUE: Multidetector CT imaging of the chest was performed using the standard protocol  during bolus administration of intravenous contrast tailored for assessment of pulmonary artery's. Multiplanar CT image reconstructions and MIPs were obtained to evaluate the vascular anatomy. CONTRAST:  100 cc Isovue 370 COMPARISON:  Chest radiograph January 27, 2017 at 1522 hours FINDINGS: CARDIOVASCULAR: Adequate contrast opacification of the pulmonary artery's. Main pulmonary artery is not enlarged. No pulmonary arterial filling defects to the level of the subsegmental branches. Heart size is mildly enlarged, no right heart strain. No pericardial effusion. Thoracic aorta is normal course and caliber, unremarkable. No dissection flap, intramural hematoma or contrast extravasation. MEDIASTINUM/NODES: No lymphadenopathy by CT size criteria. LUNGS/PLEURA: Tracheobronchial tree is patent, no pneumothorax. No pleural effusion or focal consolidation. RIGHT middle lobe atelectasis/scarring. UPPER ABDOMEN: Included view of the abdomen is nonsuspicious. Transient hepatic attenuation difference LEFT lobe and segment 4 of the liver. MUSCULOSKELETAL: Visualized soft tissues and included osseous structures are nonacute. Fluid-filled nondistended esophagus. Review of the MIP images confirms the above findings. IMPRESSION: No acute pulmonary embolism or acute vascular process on this pulmonary angiographic phase CTA chest. Mild cardiomegaly.  No acute pulmonary process. Electronically Signed   By: Awilda Metro M.D.   On: 01/27/2017 20:40     Tillman Sers, DO 01/28/2017, 1:00 AM PGY-1, [facility] FPTS Intern pager: [phone], text pages welcome  FPTS Upper-Level Resident Addendum  I have independently interviewed and examined the patient. I have discussed the above with the original author and agree with their documentation. My edits for correction/addition/clarification are in blue. Please see also any attending notes.   Willadean , MD PGY-2, [facility] FPTS Service pager:  [phone] (text pages welcome through [employer]ION)

## 2017-01-27 NOTE — ED Notes (Signed)
Pt states she is feeling better since lying flat.

## 2017-01-27 NOTE — ED Notes (Addendum)
Attempted report.  3W states pt going to 17 (instead of 22)

## 2017-01-27 NOTE — ED Notes (Signed)
Report from Chad, RN.

## 2017-01-27 NOTE — ED Notes (Signed)
PT reports sudden onset of dizziness and feeling like she is going to pass out.  Pt placed flat.  EDP and resident called to bedside and EKG repeated.

## 2017-01-27 NOTE — ED Provider Notes (Signed)
[facility]-EMERGENCY DEPT Provider Note   CSN: [id number] Arrival date & time: 01/27/17  1428     History   Chief Complaint Chief Complaint  Patient presents with  . Chest Pain  . Dizziness    HPI Brittany Collier is a 66 y.o. female.  HPI  Patient is a 66 year old female past medical history significant for anxiety and hypertension, who presents to the emergency department with a four-day history of headaches, chest pain, lightheadedness. Patient states that over the past 4 days she "has just felt like she is in a fog" and felt lightheaded. Intermittent episodes of left-sided headaches that are dull, nonradiating, nothing improves or worsens. States she does not have a history of headaches. Last night while laying in bed she had left-sided chest pressure that radiated down the left arm. Associated with tingling of the left arm. Not worse with exertion. Slowly the pain improved on its own. No associated nausea, vomiting, diaphoresis, shortness of breath. Has not taken anything for the pain. States she checked her blood pressure last night and was normal. No recent changes to her antihypertensive medications, only on HCTZ 25 mg. Denies change in vision, slurring of speech, diplopia, jaw claudication, extremity numbness or weakness. No recent falls or head trauma. No recent fever or chills. Has not missed any of her antihypertensive medications  Past Medical History:  Diagnosis Date  . Anxiety   . Hypertension     Patient Active Problem List   Diagnosis Date Noted  . Chest pain 01/27/2017    History reviewed. No pertinent surgical history.  OB History    No data available       Home Medications    Prior to Admission medications   Medication Sig Start Date End Date Taking? Authorizing Provider  ALPRAZolam (XANAX XR) 1 MG 24 hr tablet Take 1 mg by mouth 2 (two) times daily.   Yes [provider]  ALPRAZolam Prudy Feeler) 0.5 MG tablet Take 0.5 mg by mouth 2 (two) times daily as  needed for anxiety.   Yes [provider]  hydrochlorothiazide (HYDRODIURIL) 25 MG tablet Take 25 mg by mouth daily. 09/17/14  Yes [provider]  Multiple Vitamins-Minerals (MULTIVITAMIN WITH MINERALS) tablet Take 1 tablet by mouth daily.   Yes [provider]  Omega-3 Fatty Acids (FISH OIL) 1200 MG CAPS Take 1,200 mg by mouth daily.   Yes [provider]  potassium chloride (K-DUR,KLOR-CON) 10 MEQ tablet Take 10 mEq by mouth daily.   Yes [provider]  clindamycin (CLEOCIN) 150 MG capsule Take 300 mg by mouth 4 (four) times daily. 11/04/14   [provider]    Family History No family history on file.  Social History Social History  Substance Use Topics  . Smoking status: Never Smoker  . Smokeless tobacco: Not on file  . Alcohol use Yes     Allergies   Penicillins   Review of Systems Review of Systems  Constitutional: Positive for fatigue. Negative for appetite change and fever.  HENT: Negative for congestion.   Eyes: Positive for photophobia. Negative for visual disturbance.  Respiratory: Positive for chest tightness. Negative for cough and shortness of breath.   Cardiovascular: Positive for chest pain. Negative for palpitations and leg swelling.  Gastrointestinal: Negative for abdominal pain, diarrhea, nausea and vomiting.  Genitourinary: Negative for decreased urine volume, dysuria, flank pain and hematuria.  Musculoskeletal: Negative for back pain and gait problem.  Skin: Negative for rash.  Neurological: Positive for light-headedness and  headaches. Negative for dizziness, seizures, syncope, speech difficulty, weakness and numbness.  Psychiatric/Behavioral: Negative for behavioral problems.     Physical Exam Updated Vital Signs BP (!) 141/80 (BP Location: Left Arm)   Pulse 79   Temp 99.7 F (37.6 C) (Oral)   Resp 19   Ht 5\' 4"  (1.626 m)   Wt 68.7 kg (151 lb 6.4 oz)   SpO2 98%   BMI 25.99 kg/m   Physical  Exam  Constitutional: She is oriented to person, place, and time. She appears well-developed and well-nourished.  HENT:  Head: Atraumatic.  Mouth/Throat: Oropharynx is clear and moist.  Eyes: Conjunctivae and EOM are normal.  Neck: Normal range of motion. No JVD present.  Cardiovascular: Normal rate, regular rhythm, normal heart sounds and intact distal pulses.   No murmur heard. Pulmonary/Chest: Effort normal and breath sounds normal. No respiratory distress. She has no wheezes.  Abdominal: She exhibits no distension and no mass. There is no tenderness. There is no guarding.  Musculoskeletal: Normal range of motion. She exhibits no edema.  No unilateral leg swelling  Neurological: She is alert and oriented to person, place, and time. She has normal strength and normal reflexes. No cranial nerve deficit or sensory deficit. Gait normal. GCS eye subscore is 4. GCS verbal subscore is 5. GCS motor subscore is 6.  No dysmetria with finger to nose or heel to shin.  Skin: Skin is warm. No pallor.  Psychiatric: She has a normal mood and affect.     ED Treatments / Results  Labs (all labs ordered are listed, but only abnormal results are displayed) Labs Reviewed  BASIC METABOLIC PANEL - Abnormal; Notable for the following:       Result Value   Sodium 133 (*)    Chloride 98 (*)    Glucose, Bld 103 (*)    Creatinine, Ser 1.12 (*)    GFR calc non Af Amer 50 (*)    GFR calc Af Amer 58 (*)    All other components within normal limits  CBC  I-STAT TROPOININ, ED  CBG MONITORING, ED  I-STAT TROPOININ, ED    EKG  EKG Interpretation  Date/Time:  Friday January 27 2017 14:35:09 EDT Ventricular Rate:  58 PR Interval:  202 QRS Duration: 82 QT Interval:  440 QTC Calculation: 431 R Axis:   -69 Text Interpretation:  Sinus bradycardia Left axis deviation T wave abnormality Abnormal ekg Confirmed by Gerhard Munch [phone]) on 01/27/2017 5:02:52 PM       Radiology Dg Chest 2 View  Result  Date: 01/27/2017 CLINICAL DATA:  Left arm tingling and heaviness. EXAM: CHEST  2 VIEW COMPARISON:  None. FINDINGS: The heart size and mediastinal contours are within normal limits. Both lungs are clear. The visualized skeletal structures are unremarkable. IMPRESSION: No active cardiopulmonary disease. Electronically Signed   By: Gerome Sam III M.D   On: 01/27/2017 15:28   Ct Head Wo Contrast  Result Date: 01/27/2017 CLINICAL DATA:  Dizziness, loss of appetite, headache and numbness in left arm for 3 days. Hypertension. EXAM: CT HEAD WITHOUT CONTRAST TECHNIQUE: Contiguous axial images were obtained from the base of the skull through the vertex without intravenous contrast. COMPARISON:  Head CT dated 11/07/2014. FINDINGS: Brain: Ventricles are normal in size and configuration. There is no mass, hemorrhage, edema or other evidence of acute parenchymal abnormality. No extra-axial hemorrhage. Vascular: No hyperdense vessel or unexpected calcification. Skull: Normal. Negative for fracture or focal lesion. Sinuses/Orbits: No acute finding. Other: None.  IMPRESSION: Negative head CT.  No intracranial mass, hemorrhage or edema. Electronically Signed   By: Bary Richard M.D.   On: 01/27/2017 17:02   Ct Angio Chest Aorta W And/or Wo Contrast  Result Date: 01/27/2017 CLINICAL DATA:  Chest pain and hypertension. LEFT arm tingling for 3 days. Assess for dissection. EXAM: CT ANGIOGRAPHY CHEST WITH CONTRAST TECHNIQUE: Multidetector CT imaging of the chest was performed using the standard protocol during bolus administration of intravenous contrast tailored for assessment of pulmonary artery's. Multiplanar CT image reconstructions and MIPs were obtained to evaluate the vascular anatomy. CONTRAST:  100 cc Isovue 370 COMPARISON:  Chest radiograph January 27, 2017 at 1522 hours FINDINGS: CARDIOVASCULAR: Adequate contrast opacification of the pulmonary artery's. Main pulmonary artery is not enlarged. No pulmonary arterial  filling defects to the level of the subsegmental branches. Heart size is mildly enlarged, no right heart strain. No pericardial effusion. Thoracic aorta is normal course and caliber, unremarkable. No dissection flap, intramural hematoma or contrast extravasation. MEDIASTINUM/NODES: No lymphadenopathy by CT size criteria. LUNGS/PLEURA: Tracheobronchial tree is patent, no pneumothorax. No pleural effusion or focal consolidation. RIGHT middle lobe atelectasis/scarring. UPPER ABDOMEN: Included view of the abdomen is nonsuspicious. Transient hepatic attenuation difference LEFT lobe and segment 4 of the liver. MUSCULOSKELETAL: Visualized soft tissues and included osseous structures are nonacute. Fluid-filled nondistended esophagus. Review of the MIP images confirms the above findings. IMPRESSION: No acute pulmonary embolism or acute vascular process on this pulmonary angiographic phase CTA chest. Mild cardiomegaly.  No acute pulmonary process. Electronically Signed   By: Awilda Metro M.D.   On: 01/27/2017 20:40    Procedures Procedures (including critical care time)  Medications Ordered in ED Medications  hydrALAZINE (APRESOLINE) injection 10 mg (10 mg Intravenous Given 01/27/17 1744)  iopamidol (ISOVUE-370) 76 % injection (100 mLs  Contrast Given 01/27/17 2012)  hydrALAZINE (APRESOLINE) injection 10 mg (10 mg Intravenous Given 01/27/17 2104)  ondansetron (ZOFRAN) injection 4 mg (4 mg Intravenous Given 01/27/17 2310)     Initial Impression / Assessment and Plan / ED Course  I have reviewed the triage vital signs and the nursing notes.  Pertinent labs & imaging results that were available during my care of the patient were reviewed by me and considered in my medical decision making (see chart for details).    66 year old female with past history significant for hypertension who presents to the emergency department with high blood pressure, headache, chest pain. BP 187/105 on arrival. SPO2 90% on room  air. Afebrile. Nonfocal neurologic exam. Lungs clear to auscultation, no murmur, no signs of DVT.  Differential diagnosis includes ACS, hypertensive emergency, dissection, intracranial hemorrhage. Initial EKG showed nonspecific T-wave changes, normal sinus rhythm, PR 202, no chamber enlargement, no signs of acute ischemia. CT head showed no acute findings. Serial troponin negative. Lab work showed no significant electrolyte abnormalities. Chest x-ray unremarkable. Patient given IV hydralazine for elevated blood pressure.  On reevaluation patient continued to have headache with some lightheadedness. CTA chest dissection study obtained, showed no signs of dissection or PE.  Patient continued to have elevated blood pressure 190/110s, given second dose of IV hydralazine. Following this episode patient became lightheaded, anxious, stated that she did not feel well. BP 160/100. Repeat EKG showed new T-wave inversion to inferior and lateral leads. Patient most likely with hypertensive emergency.  Patient admitted to family medicine for further management of hypertensive emergency. Patient stable for the floor.   Final Clinical Impressions(s) / ED Diagnoses   Final diagnoses:  Hypertensive  emergency    New Prescriptions Current Discharge Medication List       Corena HerterMumma, Keontre Defino, MD 01/28/17 [id number]    Gerhard MunchLockwood, Robert, MD 01/29/17 1723

## 2017-01-27 NOTE — ED Triage Notes (Signed)
Pt presents for evaluation of lightheadedness/loss of appetite/mild cp/ and L arm tingling x 3 days. Pt reports hx of hypoglycemia, is not a diabetic. Reports similar symptoms with hypoglycemic event.

## 2017-01-27 NOTE — ED Notes (Signed)
Attempted to raise back of stretcher for pt to sit up again and she became nauseous within 5 minutes.  Bed placed flat again. Dr. Jeraldine LootsLockwood notified of same.

## 2017-01-27 NOTE — ED Notes (Addendum)
Admitting MD at bedside.  Pt given Mal[place]urkey sandwich, apple sauce, and water.

## 2017-01-28 ENCOUNTER — Observation Stay (HOSPITAL_BASED_OUTPATIENT_CLINIC_OR_DEPARTMENT_OTHER): Payer: Medicare Other

## 2017-01-28 ENCOUNTER — Encounter (HOSPITAL_COMMUNITY): Payer: Self-pay | Admitting: *Deleted

## 2017-01-28 DIAGNOSIS — R079 Chest pain, unspecified: Secondary | ICD-10-CM

## 2017-01-28 DIAGNOSIS — I360 Nonrheumatic tricuspid (valve) stenosis: Secondary | ICD-10-CM | POA: Diagnosis not present

## 2017-01-28 DIAGNOSIS — I10 Essential (primary) hypertension: Secondary | ICD-10-CM | POA: Diagnosis not present

## 2017-01-28 DIAGNOSIS — I161 Hypertensive emergency: Secondary | ICD-10-CM | POA: Diagnosis not present

## 2017-01-28 DIAGNOSIS — Z79899 Other long term (current) drug therapy: Secondary | ICD-10-CM | POA: Diagnosis not present

## 2017-01-28 DIAGNOSIS — R51 Headache: Secondary | ICD-10-CM | POA: Diagnosis not present

## 2017-01-28 LAB — BASIC METABOLIC PANEL
ANION GAP: 10 (ref 5–15)
BUN: 12 mg/dL (ref 6–20)
CO2: 24 mmol/L (ref 22–32)
Calcium: 9.6 mg/dL (ref 8.9–10.3)
Chloride: 102 mmol/L (ref 101–111)
Creatinine, Ser: 1.11 mg/dL — ABNORMAL HIGH (ref 0.44–1.00)
GFR calc Af Amer: 58 mL/min — ABNORMAL LOW (ref >60)
GFR, EST NON AFRICAN AMERICAN: 50 mL/min — AB (ref >60)
Glucose, Bld: 91 mg/dL (ref 65–99)
POTASSIUM: 3.2 mmol/L — AB (ref 3.5–5.1)
SODIUM: 136 mmol/L (ref 135–145)

## 2017-01-28 LAB — ECHOCARDIOGRAM COMPLETE
HEIGHTINCHES: 64 in
Weight: 2414.4 oz

## 2017-01-28 LAB — CBC
HEMATOCRIT: 37.5 % (ref 36.0–46.0)
Hemoglobin: 13 g/dL (ref 12.0–15.0)
MCH: 31.9 pg (ref 26.0–34.0)
MCHC: 34.7 g/dL (ref 30.0–36.0)
MCV: 91.9 fL (ref 78.0–100.0)
Platelets: 165 10*3/uL (ref 150–400)
RBC: 4.08 MIL/uL (ref 3.87–5.11)
RDW: 13.9 % (ref 11.5–15.5)
WBC: 5.9 10*3/uL (ref 4.0–10.5)

## 2017-01-28 LAB — TROPONIN I
Troponin I: <0.03 ng/mL (ref <0.03)
Troponin I: <0.03 ng/mL (ref <0.03)

## 2017-01-28 LAB — TSH: TSH: 1.377 u[IU]/mL (ref 0.350–4.500)

## 2017-01-28 MED ORDER — SODIUM CHLORIDE 0.9% FLUSH
3.0000 mL | Freq: Two times a day (BID) | INTRAVENOUS | Status: DC
  Administered 2017-01-28: 3 mL via INTRAVENOUS

## 2017-01-28 MED ORDER — ACETAMINOPHEN 325 MG PO TABS
650.0000 mg | ORAL_TABLET | Freq: Four times a day (QID) | ORAL | Status: DC | PRN
  Administered 2017-01-28 (×2): 650 mg via ORAL
  Filled 2017-01-28 (×2): qty 2

## 2017-01-28 MED ORDER — ALPRAZOLAM 0.5 MG PO TABS
1.0000 mg | ORAL_TABLET | Freq: Two times a day (BID) | ORAL | Status: DC
  Administered 2017-01-28: 1 mg via ORAL
  Filled 2017-01-28: qty 2

## 2017-01-28 MED ORDER — ACETAMINOPHEN 650 MG RE SUPP: 650.0000 mg | Freq: Four times a day (QID) | RECTAL | Status: DC | PRN

## 2017-01-28 MED ORDER — ALPRAZOLAM 0.5 MG PO TABS
0.5000 mg | ORAL_TABLET | Freq: Two times a day (BID) | ORAL | Status: DC | PRN
  Administered 2017-01-28: 0.5 mg via ORAL
  Filled 2017-01-28: qty 1

## 2017-01-28 MED ORDER — MELATONIN 3 MG PO TABS
9.0000 mg | ORAL_TABLET | Freq: Every evening | ORAL | Status: DC | PRN
  Administered 2017-01-28: 9 mg via ORAL
  Filled 2017-01-28 (×2): qty 3

## 2017-01-28 MED ORDER — HYDROCHLOROTHIAZIDE 25 MG PO TABS
25.0000 mg | ORAL_TABLET | Freq: Every day | ORAL | Status: DC
  Administered 2017-01-28: 25 mg via ORAL
  Filled 2017-01-28: qty 1

## 2017-01-28 MED ORDER — HYDRALAZINE HCL 20 MG/ML IJ SOLN: 10.0000 mg | INTRAMUSCULAR | Status: DC | PRN

## 2017-01-28 MED ORDER — POTASSIUM CHLORIDE CRYS ER 20 MEQ PO TBCR
40.0000 meq | EXTENDED_RELEASE_TABLET | ORAL | Status: AC
  Administered 2017-01-28 (×2): 40 meq via ORAL
  Filled 2017-01-28 (×2): qty 4

## 2017-01-28 MED ORDER — ENOXAPARIN SODIUM 40 MG/0.4ML ~~LOC~~ SOLN
40.0000 mg | SUBCUTANEOUS | Status: DC
  Filled 2017-01-28: qty 0.4

## 2017-01-28 MED ORDER — POTASSIUM CHLORIDE CRYS ER 10 MEQ PO TBCR
10.0000 meq | EXTENDED_RELEASE_TABLET | Freq: Every day | ORAL | Status: DC
  Administered 2017-01-28: 10 meq via ORAL
  Filled 2017-01-28: qty 1

## 2017-01-28 MED ORDER — ALPRAZOLAM ER 1 MG PO TB24: 1.0000 mg | ORAL_TABLET | Freq: Two times a day (BID) | ORAL | Status: DC

## 2017-01-28 MED ORDER — SODIUM CHLORIDE 0.45 % IV SOLN
INTRAVENOUS | Status: DC
  Administered 2017-01-28: 50 mL/h via INTRAVENOUS

## 2017-01-28 NOTE — Progress Notes (Signed)
:   Family Medicine Teaching Service Daily Progress Note Intern Pager: [phone]  Patient name: Brittany HickmanCarol C Alaimo Medical record number: [id number] Date of birth: 03-20-1951 Age: 66 y.o. Gender: female  Primary Care Provider: No primary care provider on file. Consultants: cardiology Code Status: full  Pt Overview and Major Events to Date:  6/29: Admitted to FMTS with presyncope and chest discomfort  Assessment and Plan: Brittany HickmanCarol C Deitrick is a 66 y.o. female presenting with dizziness, fatigue, confusion. PMH is significant for anxiety and HTN.   Pre-Syncope- concern for cardiac cause, such as ACS given new T wave inversions or aortic stenosis given her systolic heart murmur. Dehydration, severe hypertension, and anxiety likely contributing. TSH normal. -monitor on telemetry -vitals per floor -stop IV fluids, encourage PO -orthostatic vitals pending -ECHO pending -check fasting CBGs each morning, given patient's history of hypoglycemia -treat anxiety, chest discomfort, and HTN as below  Chest discomfort- Concern for ACS given EKG with new T wave inversions in V4-V6. Heart score 5. Trops negative x 1. Repeat AM EKG with improvement in TWI. -trend troponins -patient would likely benefit from outpatient stress test  Hypertension- Takes 25 mg HCTZ at home along with potassium supplement. BPs 140s/70s overnight. -monitor BP closely -continue home HCTZ, k-dur -hydralazine PRN for systolic > 180 and diastolic > 110  Anxiety- Per patient, long history of panic attacks. She is on xanax XR 1 mg BID and 0.5 BID on home med list. She takes Xanax 1mg  XR and 0.5mg  in the morning, then takes Xanax XR again around 7pm, will take last 0.5 mg prior to bed for sleep. Reports significant stress in her life recently around her daughter's divorce and custody issues of grandchildren.  -continue home regimen -patient would benefit from outpatient behavioral health consultation and therapy -consider SSRI in addition  to home regimen  FEN/GI: heart healthy diet Prophylaxis: lovenox  Disposition: anticipate discharge home tomorrow  Subjective:  Doing well this morning. Did have a headache last night. Notes one "millisecond" of sharp chest pain.  Objective: Temp:  [97.9 F (36.6 C)-99.7 F (37.6 C)] 99.7 F (37.6 C) (06/30 0027) Pulse Rate:  [44-79] 79 (06/30 0027) Resp:  [16-25] 19 (06/29 2330) BP: (122-189)/(62-111) 141/80 (06/30 0027) SpO2:  [98 %-100 %] 98 % (06/30 0027) Weight:  [151 lb 6.4 oz (68.7 kg)] 151 lb 6.4 oz (68.7 kg) (06/30 0023) Physical Exam: General: pleasant lady, appears younger than stated age, laying in bed in NAD HEENT: EOMI, MMM Cardiovascular: RRR. 3/6 systolic murmur best heard at right upper sternal border. +2 radial and DP pulses. Chest pain not reproducible with palpation of anterior chest wall Respiratory: CTAB. No increased WOB Gastrointestinal: soft, non-tender, non-distended. No organomegaly. +BS MSK: no edema Neuro: Awake, alert, oriented  Laboratory:  Recent Labs Lab 01/27/17 1444  WBC 4.1  HGB 14.0  HCT 40.1  PLT 177    Recent Labs Lab 01/27/17 1444  NA 133*  K 3.5  CL 98*  CO2 27  BUN 17  CREATININE 1.12*  CALCIUM 9.9  GLUCOSE 103*   Trop: < 0.03 TSH 1.377  Imaging/Diagnostic Tests: EKG- TWI in II, III, aVf, V4-V6 CT head- negative CTA chest- No PE CXR- negative  Mayo, Allyn KennerKaty Dodd, MD 01/28/2017, 2:57 AM PGY-2, [facility] FPTS Intern pager: [phone], text pages welcome

## 2017-01-28 NOTE — Progress Notes (Signed)
  Echocardiogram 2D Echocardiogram has been performed.  Brittany SavoyCasey N Luciann Collier 01/28/2017, 4:19 PM

## 2017-01-28 NOTE — Progress Notes (Signed)
Transported to vehicle by Chip BoerVicki NT via [employer]. Belongings with pt. Spouse driving her home.

## 2017-01-28 NOTE — Discharge Instructions (Addendum)
It has been a pleasure taking care of you! You were admitted due to dizziness, fatigue and chest discomfort. With the tests we have done, we have ruled out life-threatening conditions. At this time, we think it is safe to let you go home and follow-up with your primary care doctor. There could be some changes made to your home medications during this hospitalization. Please, make sure to read the directions before you take them. The names and directions on how to take these medications are found on this discharge paper under medication section.  Please follow up at your primary care doctor's office. The address, date and time are found on the discharge paper under follow up section.  Once you are discharged, your primary care physician will handle any further medical issues. Please note that NO REFILLS for any discharge medications will be authorized once you are discharged, as it is imperative that you return to your primary care physician (or establish a relationship with a primary care physician if you do not have one) for your aftercare needs so that they can reassess your need for medications and monitor your lab values. Take care,

## 2017-01-28 NOTE — ED Notes (Signed)
Pt to be transported once admitting MD is finished at bedside.

## 2017-01-28 NOTE — Discharge Summary (Signed)
Family Medicine Teaching [facility] Discharge Summary  Patient name: Brittany HickmanCarol C Keel Medical record number: [id number] Date of birth: July 08, 1951 Age: 66 y.o. Gender: female Date of Admission: 01/27/2017  Date of Discharge: 01/29/2016 Admitting Physician: Carney LivingMarshall L Chambliss, MD  Primary Care Provider: No primary care provider on file. Consultants: none  Indication for Hospitalization: presyncope, chest discomfort  Discharge Diagnoses/Problem List:  Hypertension Anxiety  Disposition: home  Discharge Condition: stable  Discharge Exam: General: pleasant lady, appears younger than stated age, laying in bed in NAD HEENT: EOMI, MMM Cardiovascular: RRR. 3/6 systolic murmur best heard at right upper sternal border. +2 radial and DP pulses. Chest pain not reproducible with palpation of anterior chest wall Respiratory: CTAB. No increased WOB Gastrointestinal: soft, non-tender, non-distended. No organomegaly. +BS MSK: no edema Neuro: Awake, alert, oriented  Patient was examined by Dr. Nancy MarusMayo, who saw the patient the day of discharge.  Brief Hospital Course:  Brittany Collier is a 66 year old female presenting to the ED with vague symptoms of dizziness, fatigue, and confusion, as well as left sided chest discomfort. In the ED, she was hypertensive to 187/105 and received Hydralazine 10mg  IV x 2. Labs were unremarkable. Troponin was 0.00 and 0.01. EKG showed new TWI in II, III, avF, V4-V6. CXR negative. CT head negative. CTA chest without PE. She was admitted for work-up of her presyncope and chest discomfort. Hospital course is described by problem below.  Presyncope- Orthostatic vitals were negative for orthostasis. ECHO was obtained due to patient's symptoms and heart murmur appreciated on exam. ECHO showed EF of 60-65%, mod dilated RA & PAP of 32 mmHg suggesting mild PAH. ACS ruled out as described below.  Chest discomfort: Heart score 5. EKG in the ED with new TWI. Troponins negative x3. Repeat EKG  with resolution of TWI.   On the day of discharge, patient asymptomatic without chest pain or dyspnea. She felt happy about going home and following up with PCP. Patient understands that she has to call her PCP to make hospital follow up as she was discharged over the weekend.   Issues for Follow Up:  1. Recommend cardiology referral as an outpatient for possible stress test and evaluation of mild PAH.   Significant Procedures: none  Significant Labs and Imaging:   Recent Labs Lab 01/27/17 1444 01/28/17 0702  WBC 4.1 5.9  HGB 14.0 13.0  HCT 40.1 37.5  PLT 177 165    Recent Labs Lab 01/27/17 1444 01/28/17 0702  NA 133* 136  K 3.5 3.2*  CL 98* 102  CO2 27 24  GLUCOSE 103* 91  BUN 17 12  CREATININE 1.12* 1.11*  CALCIUM 9.9 9.6   Trop negative x3  Results/Tests Pending at Time of Discharge:  Echo as above  Discharge Medications:  Allergies as of 01/28/2017      Reactions   Penicillins    "doesnt remember"      Medication List    STOP taking these medications   clindamycin 150 MG capsule Commonly known as:  CLEOCIN     TAKE these medications   ALPRAZolam 0.5 MG tablet Commonly known as:  XANAX Take 0.5 mg by mouth 2 (two) times daily as needed for anxiety.   ALPRAZolam 1 MG 24 hr tablet Commonly known as:  XANAX XR Take 1 mg by mouth 2 (two) times daily.   Fish Oil 1200 MG Caps Take 1,200 mg by mouth daily.   hydrochlorothiazide 25 MG tablet Commonly known as:  HYDRODIURIL Take 25 mg  by mouth daily.   multivitamin with minerals tablet Take 1 tablet by mouth daily.   potassium chloride 10 MEQ tablet Commonly known as:  K-DUR,KLOR-CON Take 10 mEq by mouth daily.       Discharge Instructions: Please refer to Patient Instructions section of EMR for full details.  Patient was counseled important signs and symptoms that should prompt return to medical care, changes in medications, dietary instructions, activity restrictions, and follow up  appointments.   Follow-Up Appointments: Follow-up Information    Georgann Housekeeper, MD. Schedule an appointment as soon as possible for a visit in 1 week(s).   Specialty:  Internal Medicine Contact information: [address] [place] [address] [phone]           Almon Hercules, MD 01/29/2017, 1:31 PM PGY-3, [facility] Family Medicine

## 2017-01-28 NOTE — Plan of Care (Signed)
Problem: Education: Goal: Knowledge of Brittany Collier General Education information/materials will improve Outcome: Adequate for Discharge Aware of D/C instructions and expectations. Teach back provided regarding DX, meds, diet and activity.

## 2017-01-28 NOTE — Progress Notes (Signed)
Discharge instructions went over with pt and spouse. Questions asked and answered. IV removed from R AC, hemostasis achieved. CCMD aware of D/C and telemetry stopped. Pt dressing and belongings reminded of what she had kept here. Will transport to car via W/C when ready. Discussed diet and activity with pt. Verbalized understanding about D/C instructions and expectations.

## 2017-06-06 ENCOUNTER — Emergency Department (HOSPITAL_COMMUNITY)
Admission: EM | Admit: 2017-06-06 | Discharge: 2017-06-06 | Disposition: A | Discharge status: Home / Self Care | Payer: Medicare Other | Attending: Emergency Medicine | Admitting: Emergency Medicine

## 2017-06-06 ENCOUNTER — Encounter (HOSPITAL_COMMUNITY): Payer: Self-pay | Admitting: Nurse Practitioner

## 2017-06-06 DIAGNOSIS — Z79899 Other long term (current) drug therapy: Secondary | ICD-10-CM | POA: Insufficient documentation

## 2017-06-06 DIAGNOSIS — H538 Other visual disturbances: Secondary | ICD-10-CM | POA: Diagnosis present

## 2017-06-06 DIAGNOSIS — R030 Elevated blood-pressure reading, without diagnosis of hypertension: Secondary | ICD-10-CM

## 2017-06-06 DIAGNOSIS — F419 Anxiety disorder, unspecified: Secondary | ICD-10-CM | POA: Diagnosis not present

## 2017-06-06 DIAGNOSIS — I10 Essential (primary) hypertension: Secondary | ICD-10-CM | POA: Insufficient documentation

## 2017-06-06 LAB — CBC WITH DIFFERENTIAL/PLATELET
BASOS ABS: 0 10*3/uL (ref 0.0–0.1)
Basophils Relative: 0 %
EOS ABS: 0 10*3/uL (ref 0.0–0.7)
EOS PCT: 0 %
HEMATOCRIT: 36.1 % (ref 36.0–46.0)
HEMOGLOBIN: 12.7 g/dL (ref 12.0–15.0)
LYMPHS PCT: 28 %
Lymphs Abs: 1.8 10*3/uL (ref 0.7–4.0)
MCH: 32.1 pg (ref 26.0–34.0)
MCHC: 35.2 g/dL (ref 30.0–36.0)
MCV: 91.2 fL (ref 78.0–100.0)
MONO ABS: 0.5 10*3/uL (ref 0.1–1.0)
Monocytes Relative: 8 %
Neutro Abs: 4.1 10*3/uL (ref 1.7–7.7)
Neutrophils Relative %: 64 %
Platelets: 211 10*3/uL (ref 150–400)
RBC: 3.96 MIL/uL (ref 3.87–5.11)
RDW: 13.4 % (ref 11.5–15.5)
WBC: 6.4 10*3/uL (ref 4.0–10.5)

## 2017-06-06 LAB — BASIC METABOLIC PANEL
Anion gap: 12 (ref 5–15)
BUN: 18 mg/dL (ref 6–20)
CO2: 23 mmol/L (ref 22–32)
CREATININE: 1.17 mg/dL — AB (ref 0.44–1.00)
Calcium: 9.8 mg/dL (ref 8.9–10.3)
Chloride: 98 mmol/L — ABNORMAL LOW (ref 101–111)
GFR calc Af Amer: 55 mL/min — ABNORMAL LOW (ref >60)
GFR, EST NON AFRICAN AMERICAN: 47 mL/min — AB (ref >60)
GLUCOSE: 88 mg/dL (ref 65–99)
POTASSIUM: 3.2 mmol/L — AB (ref 3.5–5.1)
SODIUM: 133 mmol/L — AB (ref 135–145)

## 2017-06-06 NOTE — Discharge Instructions (Signed)
Please see the information and instructions below regarding your visit.  Your diagnoses today include:  1. Transient elevated blood pressure    Your exam is reassuring today.  In speaking with our neurologist on-call, he believes that your symptoms were due to a transient elevated blood pressure and are unlikely to be a mini stroke.  Tests performed today include: See side panel of your discharge paperwork for testing performed today. Vital signs are listed at the bottom of these instructions.   Blood counts-normal today.  Electrolytes. Your potassium was 3.2 today.  Increasing potassium in your diet should be sufficient to make sure you are getting enough potassium. Below is a list of good sources and servings high in potassium (in order from greatest to least):  -1 cup cooked acorn squash -1 baked potato with skin -1 cup cooked spinach -1 cup cooked lentils -1 cup cooked kidney beans -Watermelon - cup raisins -1 cup plain yogurt -1 cup orange juice, frozen -Banana -1 cup 1% low-fat milk -1 cup cooked broccoli   Medications prescribed:    Take any prescribed medications only as prescribed, and any over the counter medications only as directed on the packaging.  Please resume your normal blood pressure regimen.  Home care instructions:  Please follow any educational materials contained in this packet.   You may resume exercising.  Please ensure that you are staying hydrated while restarting your blood pressure medication to make sure that your blood pressure does not drop too low.  Follow-up instructions: Please follow-up with your primary care provider per your previously schedule appointment for further evaluation of your symptoms if they are not completely improved.   Return instructions:  Please return to the Emergency Department if you experience worsening symptoms.  Please return to the emergency department if you reexperience any blurry vision or feeling off  balance, have symptoms that affect one side of the body such as facial weakness, facial numbness, weakness or numbness in your extremities, difficulty speaking, or difficulty swallowing. Please return if you have any other emergent concerns.  Additional Information:   Your vital signs today were: BP (!) 170/105    Pulse 63    Temp 98.4 F (36.9 C) (Oral)    Resp 15    Ht 5\' 4"  (1.626 m)    Wt 68 kg (150 lb)    SpO2 100%    BMI 25.75 kg/m  If your blood pressure (BP) was elevated on multiple readings during this visit above 130 for the top number or above 80 for the bottom number, please have this repeated by your primary care provider within one month. --------------  Thank you for allowing us to participate in your care today.

## 2017-06-06 NOTE — ED Provider Notes (Signed)
[facility] EMERGENCY DEPARTMENT Provider Note   CSN: [id number] Arrival date & time: 06/06/17  1845     History   Chief Complaint Chief Complaint  Patient presents with  . Hypertension    HPI Brittany Collier is a 66 y.o. female.  HPI   Patient is a 66-year female with a history of anxiety and hypertension presenting for blurry vision in the setting of elevated blood pressure.  Patient reports that she has been changing and titrating her antihypertensives per PCP, and was off her antihypertensive for 6 days.  Patient takes hydrochlorthiazide.  Patient reports that she monitors her blood pressure daily.  She noted yesterday that it was elevated to the 190 systolic range and made an appointment with her PCP today.  It was checked in the office and noted to be up to 200 systolic.  This resolved down to 159/90 in the office.  Patient was restarted on her HCTZ, and given return precautions by her primary care provider to go to the emergency department for any strokelike symptoms including blurry vision.  Patient reports that she was making dinner in a standing position when she noticed that the floor appeared to have waves in it and this was noted in bilateral eyes.  Patient reports that she felt slightly dizzy, which she describes as an off-balance feeling and feeling "distant". It required her to sit down.  Patient is unsure if this was a vertiginous symptom.  Patient said that the symptoms occurred for approximately 5 minutes before paramedics arrived.  Patient reports intermittent slight headaches in a frontal distribution.  Patient denies any numbness, dysphagia, dysarthria, weakness or loss of sensation or distal extremities.  Patient denies any chest pain or shortness of breath. Patient is very physically active.    On chart review, patient had a hypertensive urgency presentation in June of 2018 where she demonstrated T-wave inversions in the setting of elevated BP.  Past  Medical History:  Diagnosis Date  . Anxiety   . Hypertension     Patient Active Problem List   Diagnosis Date Noted  . Chest pain 01/27/2017    History reviewed. No pertinent surgical history.  OB History    No data available       Home Medications    Prior to Admission medications   Medication Sig Start Date End Date Taking? Authorizing Provider  ALPRAZolam Prudy Feeler) 0.5 MG tablet Take 0.5 mg by mouth 2 (two) times daily as needed for anxiety.   Yes [provider]  ALPRAZolam (XANAX XR) 1 MG 24 hr tablet Take 1 mg by mouth 2 (two) times daily.    [provider]  hydrochlorothiazide (HYDRODIURIL) 25 MG tablet Take 25 mg by mouth daily. 09/17/14   [provider]  Multiple Vitamins-Minerals (MULTIVITAMIN WITH MINERALS) tablet Take 1 tablet by mouth daily.    [provider]  Omega-3 Fatty Acids (FISH OIL) 1200 MG CAPS Take 1,200 mg by mouth daily.    [provider]  potassium chloride (K-DUR,KLOR-CON) 10 MEQ tablet Take 10 mEq by mouth daily.    [provider]    Family History No family history on file.  Social History Social History   Tobacco Use  . Smoking status: Never Smoker  Substance Use Topics  . Alcohol use: Yes  . Drug use: Not on file     Allergies   Penicillins   Review of Systems Review of Systems  Constitutional: Negative for chills and fever.  HENT: Negative for congestion, rhinorrhea and sore throat.   Eyes: Positive for visual disturbance.  Respiratory: Negative for cough, chest tightness and shortness of breath.   Cardiovascular: Negative for chest pain, palpitations and leg swelling.  Gastrointestinal: Negative for nausea and vomiting.  Genitourinary: Negative for dysuria and flank pain.  Musculoskeletal: Negative for back pain and myalgias.  Skin: Negative for rash.  Neurological: Positive for dizziness. Negative for syncope.  All other systems reviewed and are  negative.    Physical Exam Updated Vital Signs BP (!) 163/98   Pulse 68   Temp 98.4 F (36.9 C) (Oral)   Resp 18   Ht 5\' 4"  (1.626 m)   Wt 68 kg (150 lb)   SpO2 100%   BMI 25.75 kg/m   Physical Exam  Constitutional: She appears well-developed and well-nourished. No distress.  HENT:  Head: Normocephalic and atraumatic.  Mouth/Throat: Oropharynx is clear and moist.  Eyes: Conjunctivae and EOM are normal. Pupils are equal, round, and reactive to light.  Funduscopic exam demonstrates no retinal hemorrhages.   Neck: Normal range of motion. Neck supple.  Cardiovascular: Normal rate, regular rhythm, S1 normal and S2 normal.  Murmur heard. Soft systolic ejection murmur auscultated.   Pulmonary/Chest: Effort normal and breath sounds normal. She has no wheezes. She has no rales.  Abdominal: Soft. She exhibits no distension. There is no tenderness. There is no guarding.  Musculoskeletal: Normal range of motion. She exhibits no edema or deformity.  Lymphadenopathy:    She has no cervical adenopathy.  Neurological: She is alert.  Mental Status:  Alert, oriented, thought content appropriate, able to give a coherent history. Speech fluent without evidence of aphasia. Able to follow 2 step commands without difficulty.  Cranial Nerves:  II:  Peripheral visual fields grossly normal, pupils equal, round, reactive to light III,IV, VI: ptosis not present, extra-ocular motions intact bilaterally  V,VII: smile symmetric, facial light touch sensation equal VIII: hearing grossly normal to voice  X: uvula elevates symmetrically  XI: bilateral shoulder shrug symmetric and strong XII: midline tongue extension without fassiculations Motor:  Normal tone. 5/5 in upper and lower extremities bilaterally including strong and equal grip strength and dorsiflexion/plantar flexion Sensory: Llight touch sensation intact in all extremities. Deep Tendon Reflexes: 2+ and symmetric in the biceps and patella. No  clonus. Cerebellar: normal finger-to-nose with bilateral upper extremities Gait: normal gait and balance Stance: Romberg negative. No pronator drift and good coordination, strength, and position sense with tapping of bilateral arms (performed in sitting position). Gait normal and symmetric with good coordination.  Patient able to heel walk and toe walk without difficulty. CV: distal pulses palpable throughout   Skin: Skin is warm and dry. No rash noted. No erythema.  Psychiatric: She has a normal mood and affect. Her behavior is normal. Judgment and thought content normal.  Nursing note and vitals reviewed.    ED Treatments / Results  Labs (all labs ordered are listed, but only abnormal results are displayed) Labs Reviewed  BASIC METABOLIC PANEL - Abnormal; Notable for the following components:      Result Value   Sodium 133 (*)    Potassium 3.2 (*)    Chloride 98 (*)    Creatinine, Ser 1.17 (*)    GFR calc non Af Amer 47 (*)    GFR calc Af Amer 55 (*)    All other components within normal limits  CBC WITH DIFFERENTIAL/PLATELET    EKG  EKG Interpretation  Date/Time:  Tuesday  June 06 2017 19:04:07 EST Ventricular Rate:  61 PR Interval:    QRS Duration: 80 QT Interval:  426 QTC Calculation: 430 R Axis:   6 Text Interpretation:  Sinus rhythm Prolonged PR interval Probable left atrial enlargement Probable anteroseptal infarct, old No significant change since last tracing Confirmed by Gwyneth SproutPlunkett, Whitney ([id number]) on 06/06/2017 7:20:11 PM       Radiology No results found.  Procedures Procedures (including critical care time)  Medications Ordered in ED Medications - No data to display   Initial Impression / Assessment and Plan / ED Course  I have reviewed the triage vital signs and the nursing notes.  Pertinent labs & imaging results that were available during my care of the patient were reviewed by me and considered in my medical decision making (see chart for  details).     Final Clinical Impressions(s) / ED Diagnoses   Final diagnoses:  None   Patient is well-appearing and in no acute distress on examination.  Patient is anxious regarding blood pressure.  Differential diagnosis includes TIA, hypertensive encephalopathy, hypertensive urgency.  Patient has a nonfocal neurologic exam. History slightly concerning for TIA symptoms of a posterior circulation etiology. Will obtain neuro consult.  Consult with Dr. Otelia LimesLindzen regarding whether this is a suspicious TIA story.  Per consult, Dr. Otelia LimesLindzen stated that this is unlikely to be TIA and likely more characteristic of hypertensive encephalopathy.  This was discussed with patient as well as Dr. Gwyneth SproutWhitney Plunkett.  Will not proceed with MRI at this time, and patient given strict return precautions for any recurrence of her blurry vision or feeling of off balance, one-sided symptoms such as weakness, numbness, facial weakness or numbness, difficulty speaking, or difficulty swallowing.  Patient has close follow-up with her primary care provider who is titrating her medication.  Patient is in understanding and agrees with the plan of care.  Blood pressure is elevated in the emergency department today, however repeat blood pressures demonstrate to be in the 160s-150 systolic range over 95-97 diastolic.  Patient had more singular readings 170/105.  Patient reports that when she was previously on hydrochlorothiazide, she was able to have her blood pressure under control in the 120s systolic.   This is a shared visit with Dr. Gwyneth SproutWhitney Plunkett. Patient was independently evaluated by this attending physician. Attending physician consulted in evaluation and discharge management.  ED Discharge Orders    None       Delia ChimesMurray, Jency Schnieders B, PA-C 06/06/17 2235    Gwyneth SproutPlunkett, Whitney, MD 06/06/17 2320

## 2017-06-06 NOTE — ED Triage Notes (Signed)
Pt arrived via [facility] c/o of elevated bp and blurred vision. Pt states she was at home cooking dinner and "saw a stream of water running across her kitchen floor". Pt stated she looked up and the blurred vision continued. Pt recently restarted her HCTZ after being off of the medication for some time. She stated she took a dose last night and again this morning due to a recent increase in BP. Pt c/o slight headache today. She is A&Ox4 and has no other complaints at this time.

## 2017-11-08 ENCOUNTER — Other Ambulatory Visit (HOSPITAL_COMMUNITY): Payer: Self-pay | Admitting: Psychiatry

## 2017-11-18 ENCOUNTER — Other Ambulatory Visit (HOSPITAL_COMMUNITY): Payer: Self-pay | Admitting: Psychiatry

## 2017-12-28 ENCOUNTER — Other Ambulatory Visit: Payer: Self-pay | Admitting: Family Medicine

## 2017-12-28 DIAGNOSIS — Z1231 Encounter for screening mammogram for malignant neoplasm of breast: Secondary | ICD-10-CM

## 2018-01-17 ENCOUNTER — Ambulatory Visit
Admission: RE | Admit: 2018-01-17 | Discharge: 2018-01-17 | Disposition: A | Discharge status: Home / Self Care | Payer: Medicare Other | Source: Ambulatory Visit | Attending: Family Medicine | Admitting: Family Medicine

## 2018-01-17 DIAGNOSIS — Z1231 Encounter for screening mammogram for malignant neoplasm of breast: Secondary | ICD-10-CM

## 2018-02-02 ENCOUNTER — Other Ambulatory Visit: Payer: Self-pay | Admitting: Obstetrics and Gynecology

## 2018-02-02 ENCOUNTER — Other Ambulatory Visit (HOSPITAL_COMMUNITY)
Admission: RE | Admit: 2018-02-02 | Discharge: 2018-02-02 | Disposition: A | Discharge status: Home / Self Care | Payer: Medicare Other | Source: Ambulatory Visit | Attending: Obstetrics and Gynecology | Admitting: Obstetrics and Gynecology

## 2018-02-02 DIAGNOSIS — Z01411 Encounter for gynecological examination (general) (routine) with abnormal findings: Secondary | ICD-10-CM | POA: Diagnosis present

## 2018-02-06 LAB — CYTOLOGY - PAP
DIAGNOSIS: NEGATIVE
HPV: NOT DETECTED

## 2018-07-14 IMAGING — CT CT HEAD W/O CM
4 series · 16 of 47 positions shown, 18 images · non-contrast
Comparison: Head CT dated 11/07/2014.

CLINICAL DATA: Dizziness, loss of appetite, headache and numbness
in left arm for 3 days. Hypertension.

EXAM:
CT HEAD WITHOUT CONTRAST
TECHNIQUE: Contiguous axial images were obtained from the base of the skull
through the vertex without intravenous contrast.

[Series 3: head wo · axial · 0.40mm/px · z∈[-88,+26]mm · 7 of 31 slices shown, 9 images]
[im 4/31  brain]
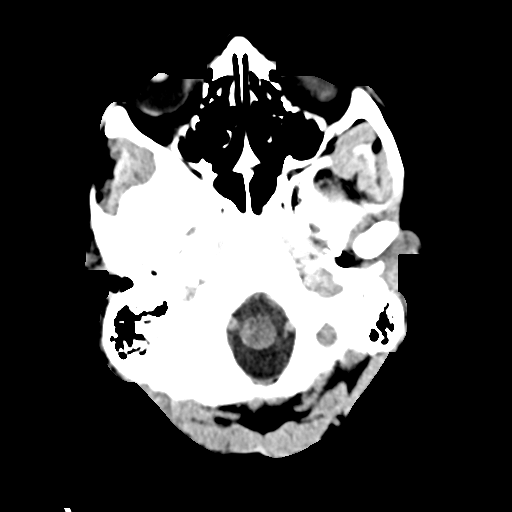
[im 4/31  bone]
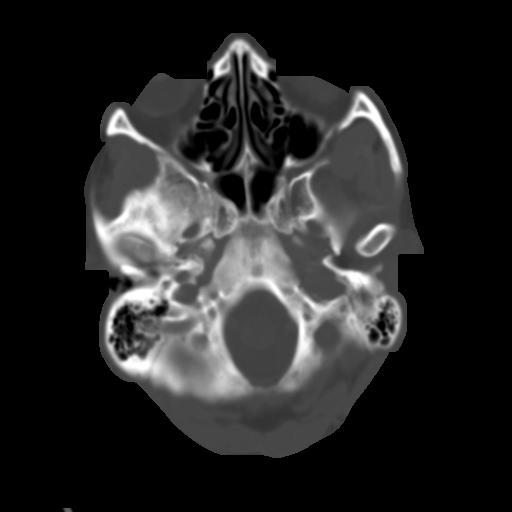
[im 8/31  brain]
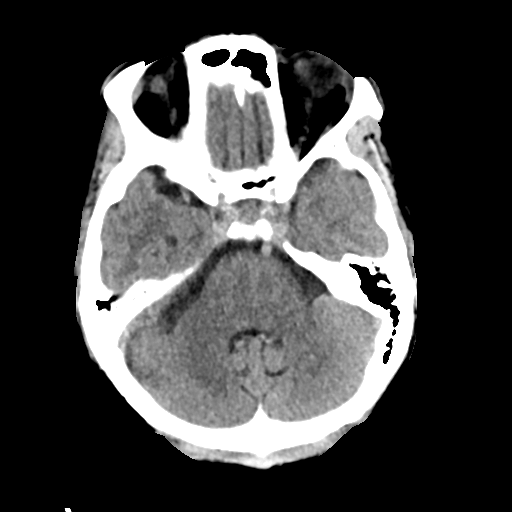
[im 12/31  brain]
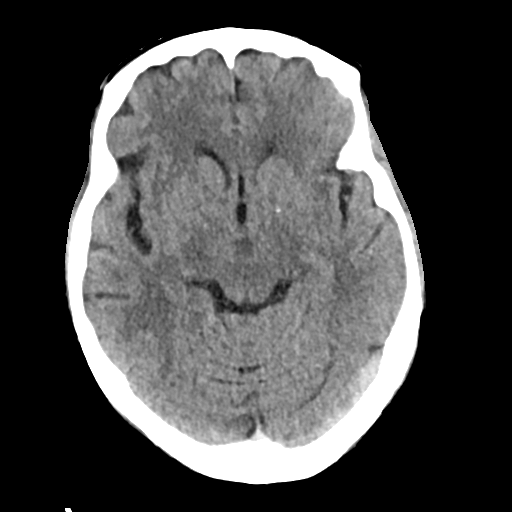
[im 16/31  brain]
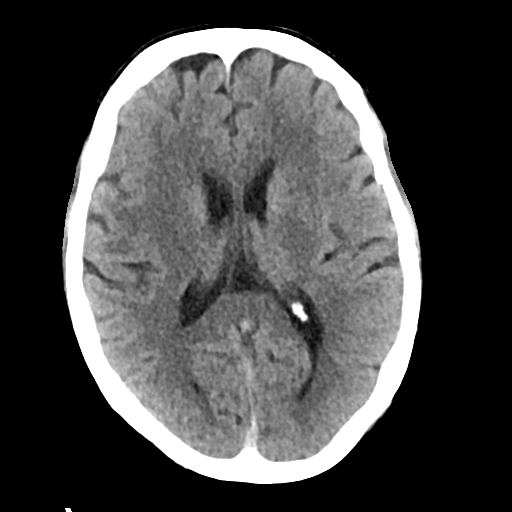
[im 19/31  brain]
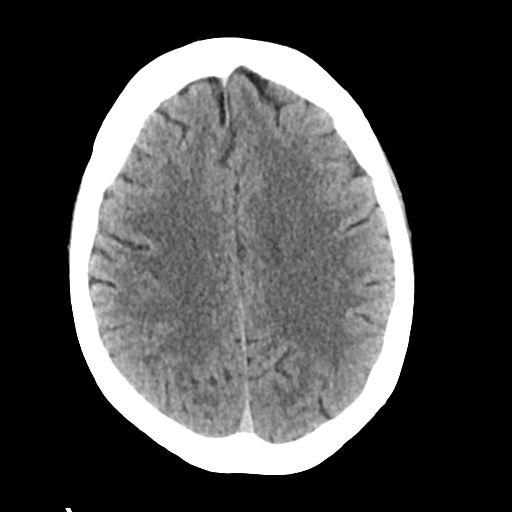
[im 19/31  bone]
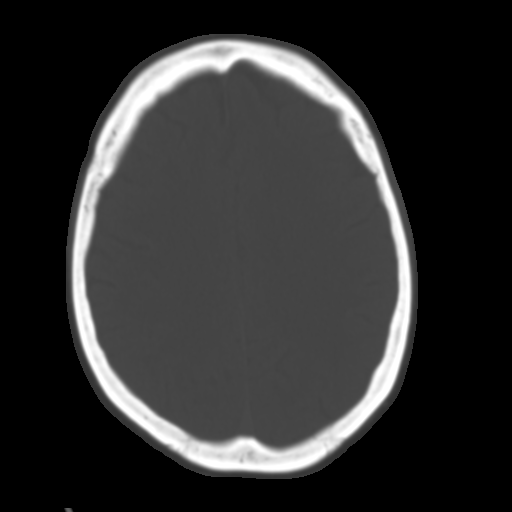
[im 23/31  brain]
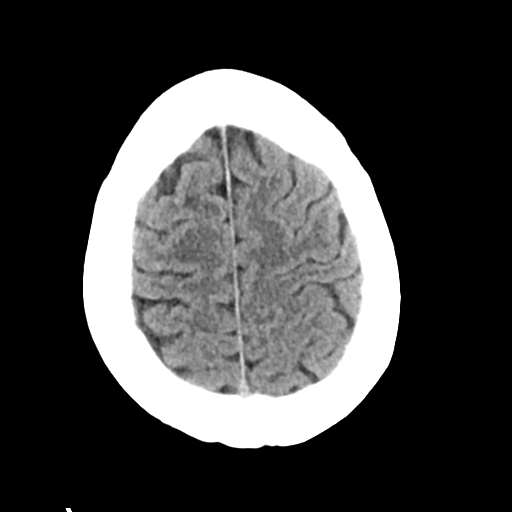
[im 27/31  brain]
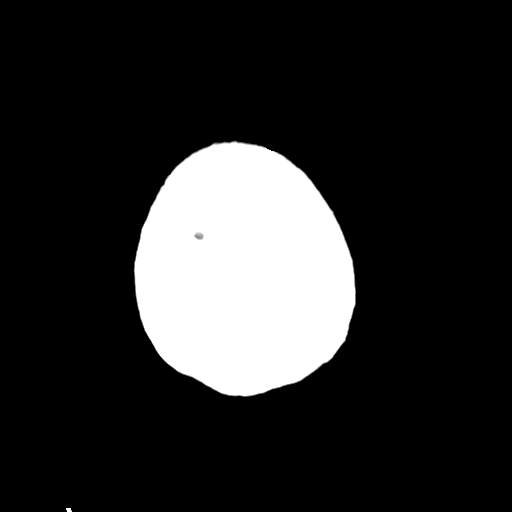

[Series 4: head bone · axial · 0.40mm/px · z∈[-90,-60]mm · 3 of 76 slices shown]
[im 8/76  bone]
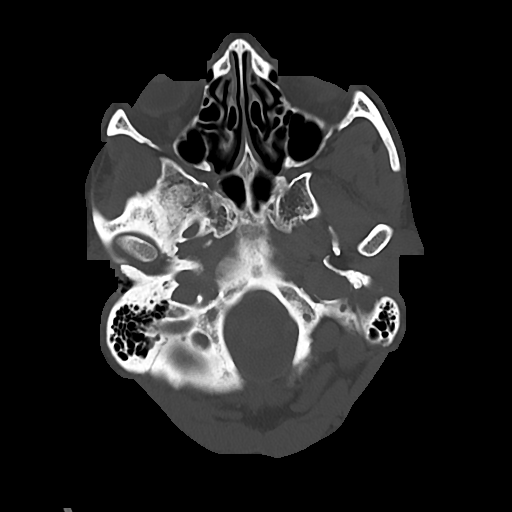
[im 16/76  bone]
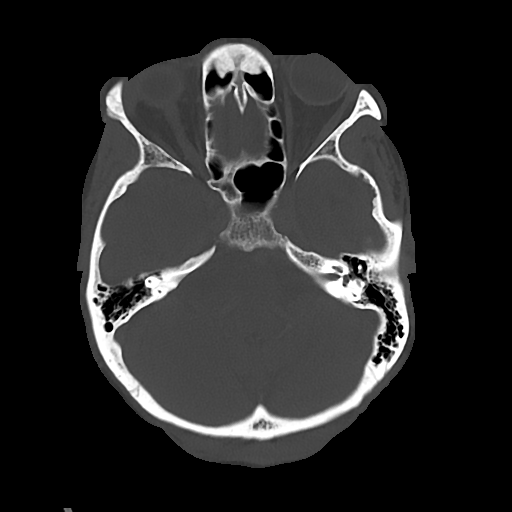
[im 23/76  bone]
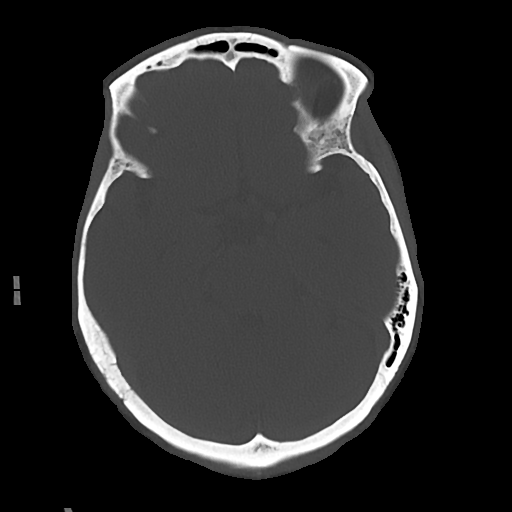

[Series 5: cor soft · coronal · 0.29mm/px · 3 of 67 slices shown]
[im 23/67  brain]
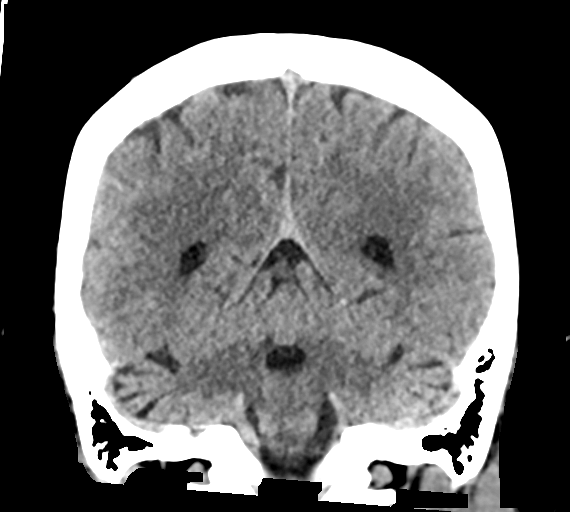
[im 30/67  brain]
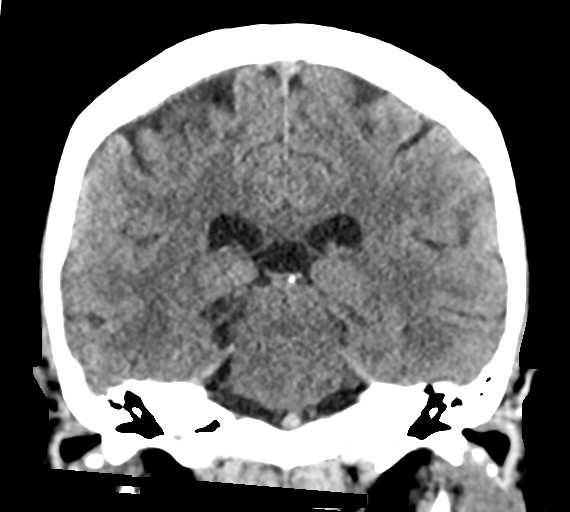
[im 37/67  brain]
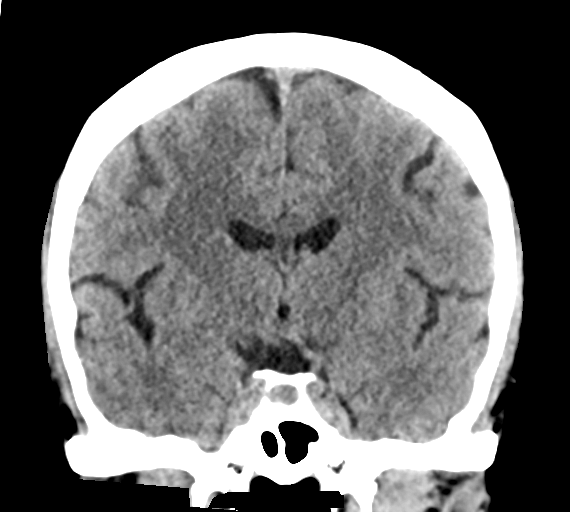

[Series 6: sag soft · sagittal · 0.29mm/px · 3 of 56 slices shown]
[im 19/56  brain]
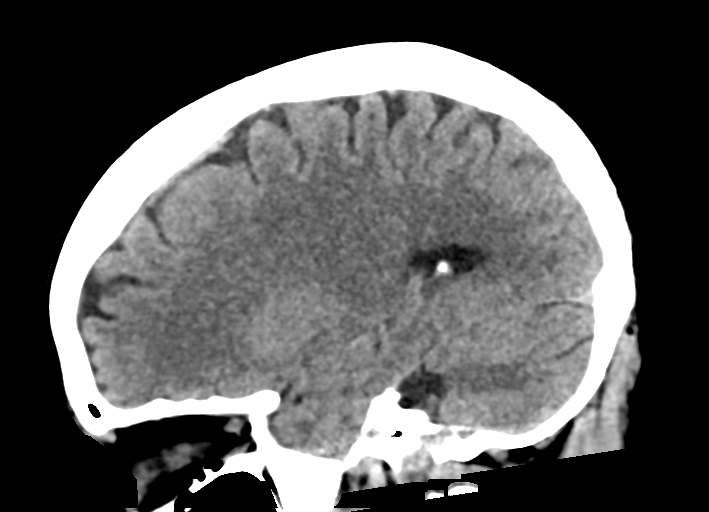
[im 28/56  brain]
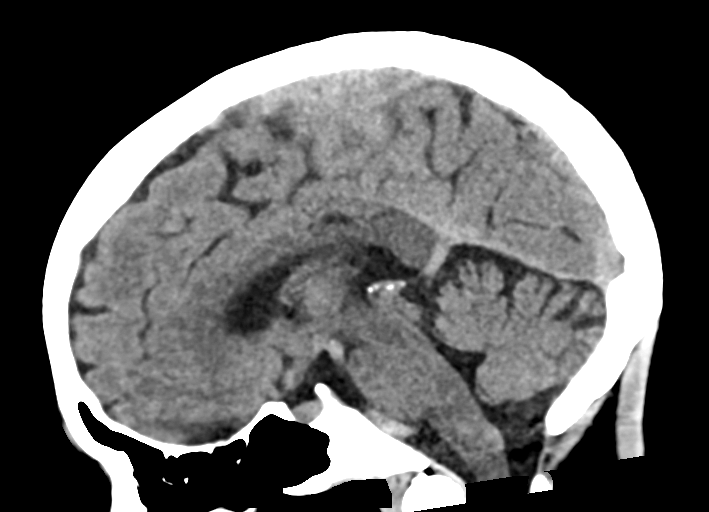
[im 37/56  brain]
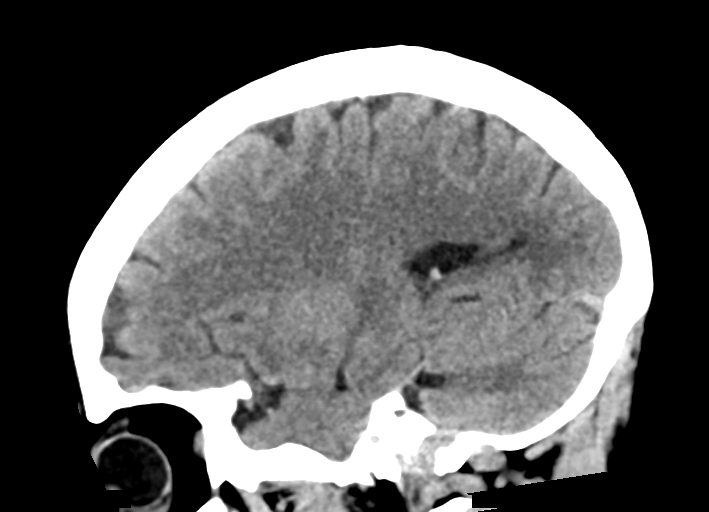

[16 of 47 positions shown; findings below may reference images not displayed]

FINDINGS: Brain: Ventricles are normal in size and configuration. There is no
mass, hemorrhage, edema or other evidence of acute parenchymal
abnormality. No extra-axial hemorrhage.

Vascular: No hyperdense vessel or unexpected calcification.

Skull: Normal. Negative for fracture or focal lesion.

Sinuses/Orbits: No acute finding.

Other: None.
IMPRESSION: Negative head CT.  No intracranial mass, hemorrhage or edema.

## 2018-07-14 IMAGING — CT CT ANGIO CHEST
2 of 6 series · 18 of 36 positions shown · IV contrast (Omni 300)
Comparison: Chest radiograph January 27, 2017 at 6666 hours

CLINICAL DATA: Chest pain and hypertension. LEFT arm tingling for 3
days. Assess for dissection.

EXAM:
CT ANGIOGRAPHY CHEST WITH CONTRAST
TECHNIQUE: Multidetector CT imaging of the chest was performed using the
standard protocol during bolus administration of intravenous
contrast tailored for assessment of pulmonary artery's. Multiplanar
CT image reconstructions and MIPs were obtained to evaluate the
vascular anatomy.
CONTRAST:  100 cc Isovue 370

[Series 7: pe thins · axial · 0.65mm/px · z∈[+1050,+1314]mm · 17 of 298 slices shown]
[im 17/298  lung]
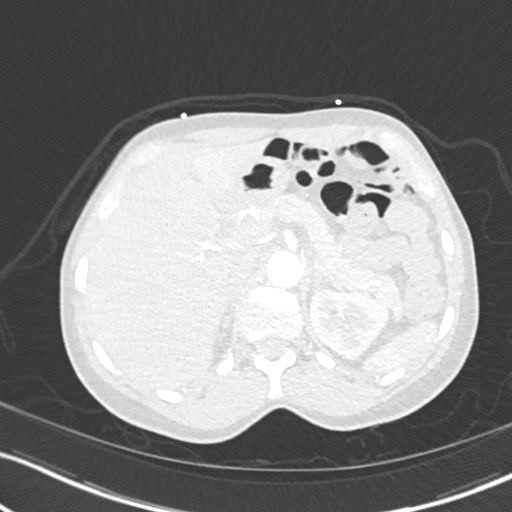
[im 34/298  mediastinal]
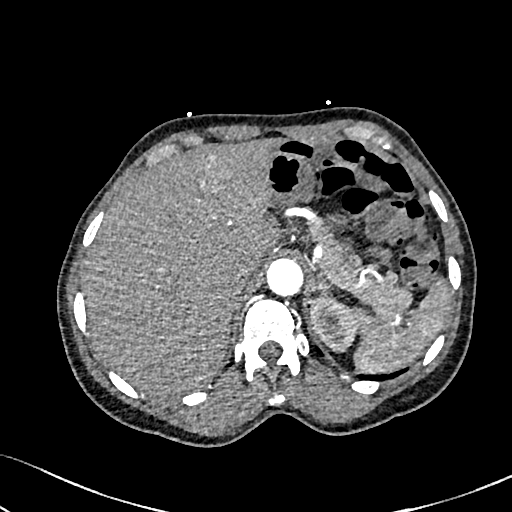
[im 50/298  lung]
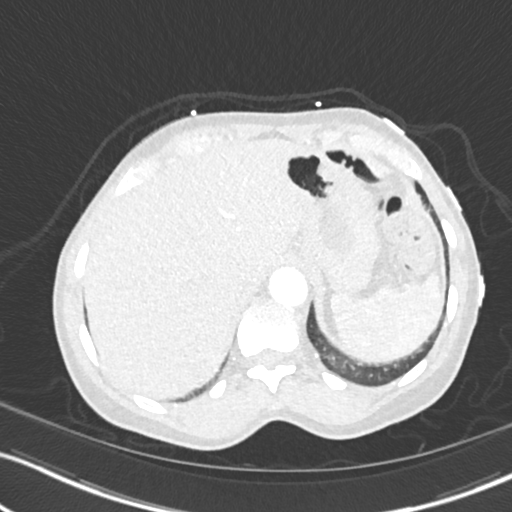
[im 67/298  mediastinal]
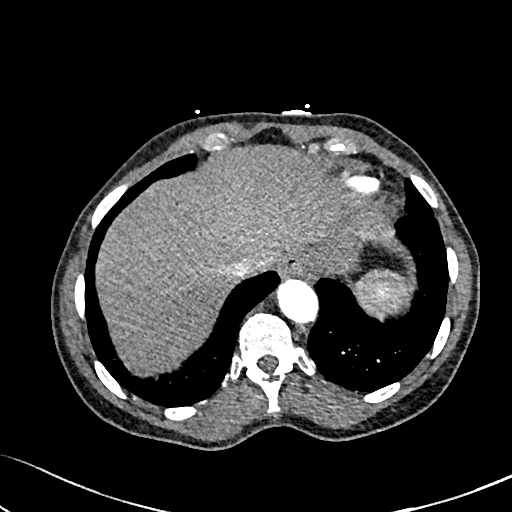
[im 83/298  lung]
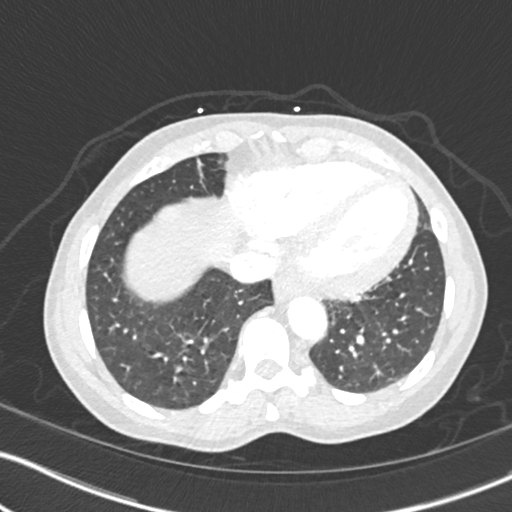
[im 100/298  mediastinal]
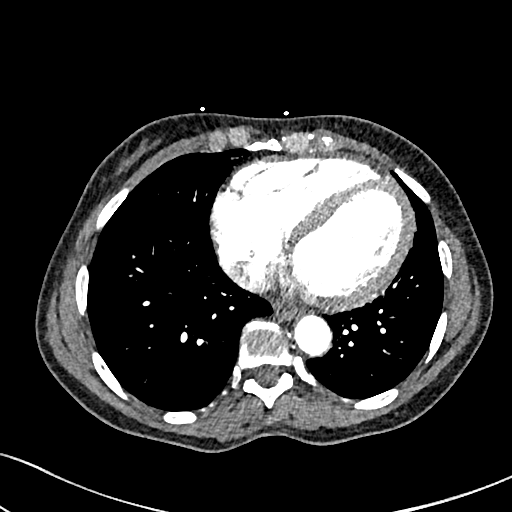
[im 116/298  lung]
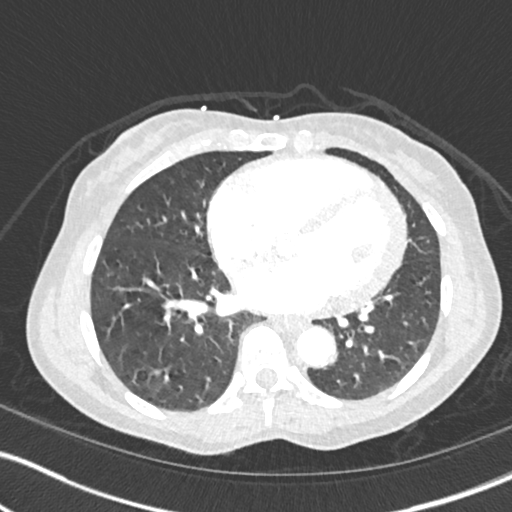
[im 133/298  mediastinal]
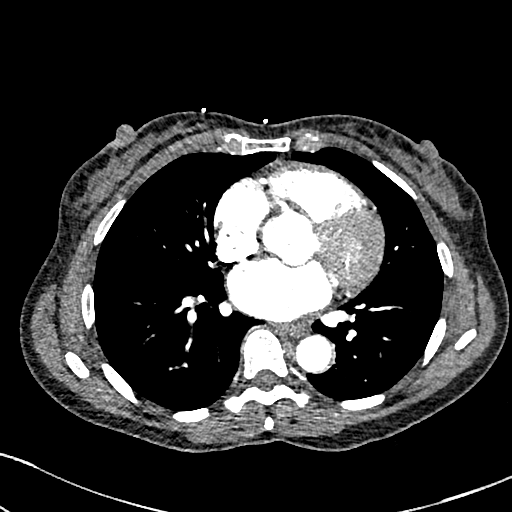
[im 149/298  lung]
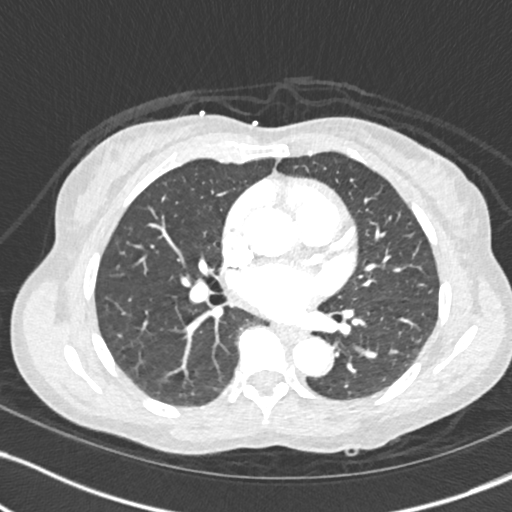
[im 166/298  mediastinal]
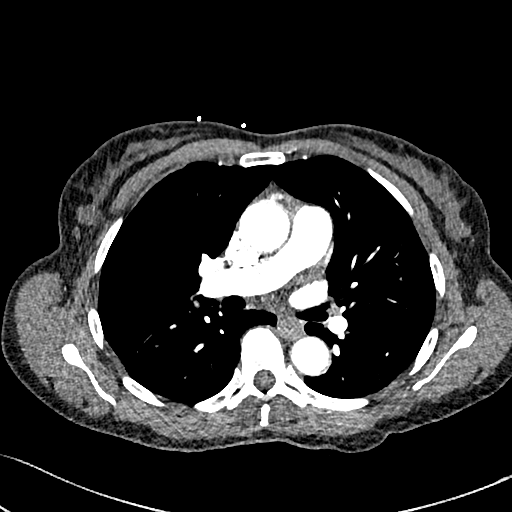
[im 182/298  lung]
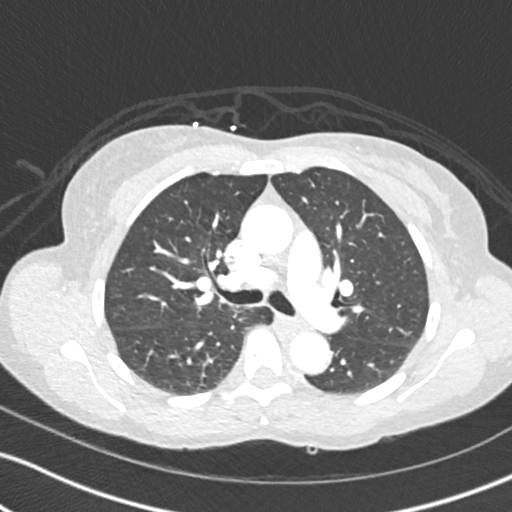
[im 199/298  mediastinal]
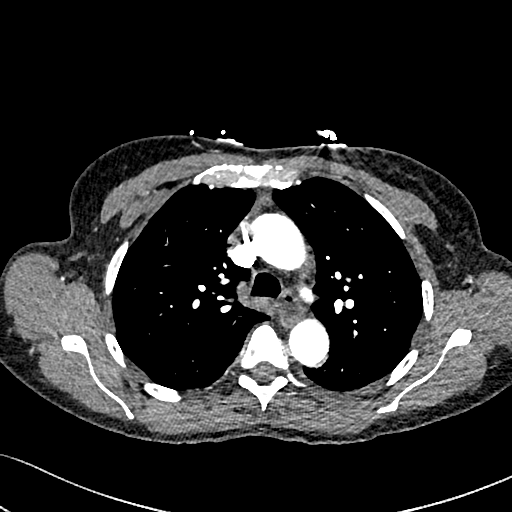
[im 215/298  lung]
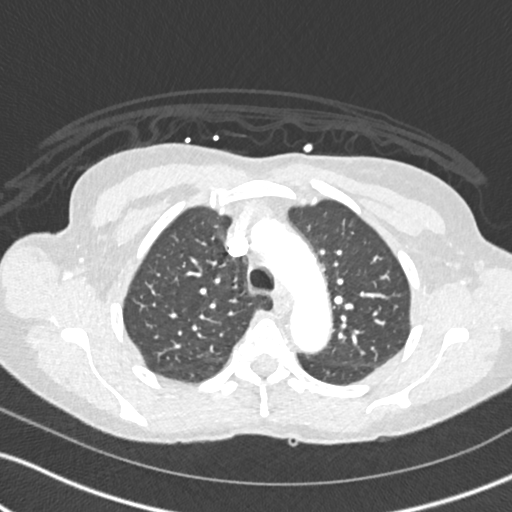
[im 232/298  mediastinal]
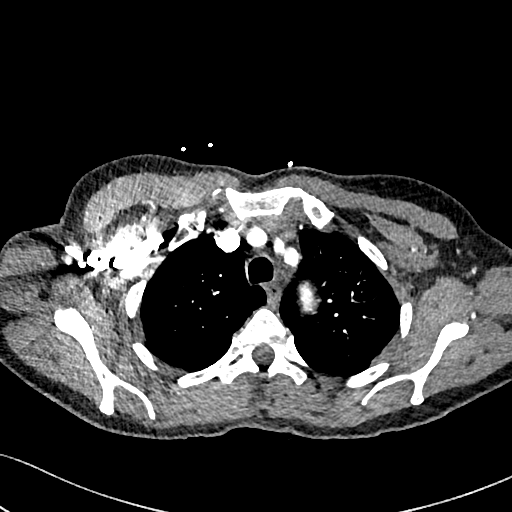
[im 248/298  lung]
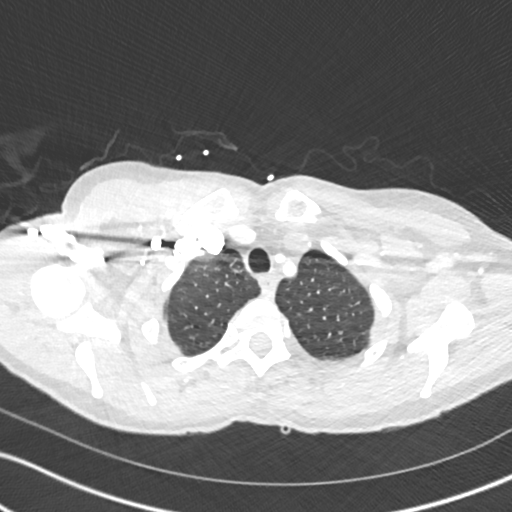
[im 265/298  mediastinal]
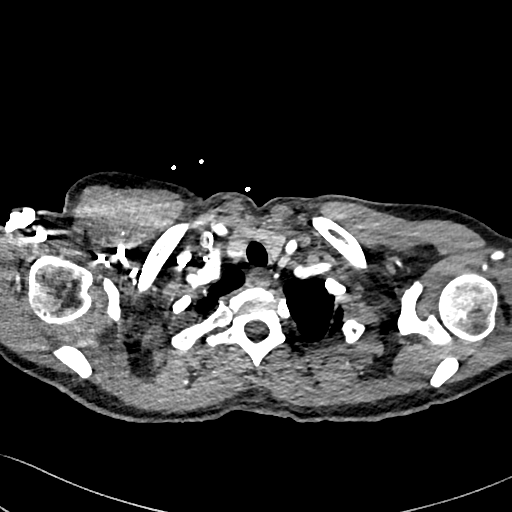
[im 281/298  lung]
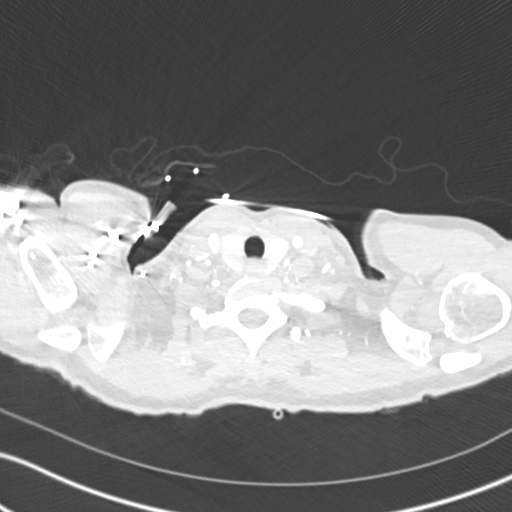

[Series 8: pe 2mm cor · coronal · 0.59mm/px · 1 of 121 slices shown]
[im 61/121  mediastinal]
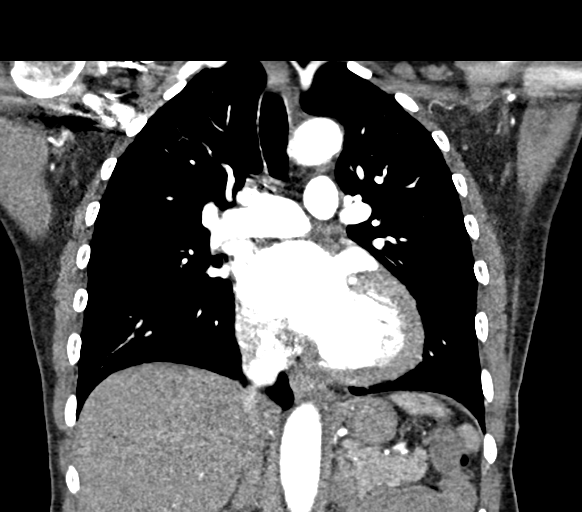

[18 of 36 positions shown; findings below may reference images not displayed]

FINDINGS: CARDIOVASCULAR: Adequate contrast opacification of the pulmonary
artery's. Main pulmonary artery is not enlarged. No pulmonary
arterial filling defects to the level of the subsegmental branches.
Heart size is mildly enlarged, no right heart strain. No pericardial
effusion. Thoracic aorta is normal course and caliber, unremarkable.
No dissection flap, intramural hematoma or contrast extravasation.

MEDIASTINUM/NODES: No lymphadenopathy by CT size criteria.

LUNGS/PLEURA: Tracheobronchial tree is patent, no pneumothorax. No
pleural effusion or focal consolidation. RIGHT middle lobe
atelectasis/scarring.

UPPER ABDOMEN: Included view of the abdomen is nonsuspicious.
Transient hepatic attenuation difference LEFT lobe and segment 4 of
the liver.

MUSCULOSKELETAL: Visualized soft tissues and included osseous
structures are nonacute. Fluid-filled nondistended esophagus.

Review of the MIP images confirms the above findings.
IMPRESSION: No acute pulmonary embolism or acute vascular process on this
pulmonary angiographic phase CTA chest.

Mild cardiomegaly.  No acute pulmonary process.

## 2020-03-16 DIAGNOSIS — F410 Panic disorder [episodic paroxysmal anxiety] without agoraphobia: Secondary | ICD-10-CM | POA: Diagnosis not present

## 2020-04-07 DIAGNOSIS — E042 Nontoxic multinodular goiter: Secondary | ICD-10-CM | POA: Diagnosis not present

## 2020-04-07 DIAGNOSIS — E782 Mixed hyperlipidemia: Secondary | ICD-10-CM | POA: Diagnosis not present

## 2020-04-07 DIAGNOSIS — D509 Iron deficiency anemia, unspecified: Secondary | ICD-10-CM | POA: Diagnosis not present

## 2020-04-07 DIAGNOSIS — F419 Anxiety disorder, unspecified: Secondary | ICD-10-CM | POA: Diagnosis not present

## 2020-04-07 DIAGNOSIS — M1712 Unilateral primary osteoarthritis, left knee: Secondary | ICD-10-CM | POA: Diagnosis not present

## 2020-04-07 DIAGNOSIS — I10 Essential (primary) hypertension: Secondary | ICD-10-CM | POA: Diagnosis not present

## 2020-04-07 DIAGNOSIS — Z0000 Encounter for general adult medical examination without abnormal findings: Secondary | ICD-10-CM | POA: Diagnosis not present

## 2020-04-07 DIAGNOSIS — Z1211 Encounter for screening for malignant neoplasm of colon: Secondary | ICD-10-CM | POA: Diagnosis not present

## 2020-04-13 ENCOUNTER — Other Ambulatory Visit: Payer: Self-pay | Admitting: Family Medicine

## 2020-04-13 DIAGNOSIS — E2839 Other primary ovarian failure: Secondary | ICD-10-CM

## 2020-04-13 DIAGNOSIS — Z1231 Encounter for screening mammogram for malignant neoplasm of breast: Secondary | ICD-10-CM

## 2020-04-13 DIAGNOSIS — Z1211 Encounter for screening for malignant neoplasm of colon: Secondary | ICD-10-CM | POA: Diagnosis not present

## 2020-07-07 ENCOUNTER — Ambulatory Visit
Admission: RE | Admit: 2020-07-07 | Discharge: 2020-07-07 | Disposition: A | Discharge status: Home / Self Care | Payer: Medicare PPO | Source: Ambulatory Visit | Attending: Family Medicine | Admitting: Family Medicine

## 2020-07-07 ENCOUNTER — Other Ambulatory Visit: Payer: Self-pay

## 2020-07-07 DIAGNOSIS — Z1231 Encounter for screening mammogram for malignant neoplasm of breast: Secondary | ICD-10-CM | POA: Diagnosis not present

## 2020-08-13 DIAGNOSIS — F410 Panic disorder [episodic paroxysmal anxiety] without agoraphobia: Secondary | ICD-10-CM | POA: Diagnosis not present

## 2020-09-04 DIAGNOSIS — Z20822 Contact with and (suspected) exposure to covid-19: Secondary | ICD-10-CM | POA: Diagnosis not present

## 2021-01-26 DIAGNOSIS — F410 Panic disorder [episodic paroxysmal anxiety] without agoraphobia: Secondary | ICD-10-CM | POA: Diagnosis not present

## 2021-04-16 DIAGNOSIS — Z23 Encounter for immunization: Secondary | ICD-10-CM | POA: Diagnosis not present

## 2021-04-16 DIAGNOSIS — Z0000 Encounter for general adult medical examination without abnormal findings: Secondary | ICD-10-CM | POA: Diagnosis not present

## 2021-04-16 DIAGNOSIS — M1712 Unilateral primary osteoarthritis, left knee: Secondary | ICD-10-CM | POA: Diagnosis not present

## 2021-04-16 DIAGNOSIS — E042 Nontoxic multinodular goiter: Secondary | ICD-10-CM | POA: Diagnosis not present

## 2021-04-16 DIAGNOSIS — I10 Essential (primary) hypertension: Secondary | ICD-10-CM | POA: Diagnosis not present

## 2021-04-16 DIAGNOSIS — E782 Mixed hyperlipidemia: Secondary | ICD-10-CM | POA: Diagnosis not present

## 2021-05-11 DIAGNOSIS — L0231 Cutaneous abscess of buttock: Secondary | ICD-10-CM | POA: Diagnosis not present

## 2021-05-27 DIAGNOSIS — L0231 Cutaneous abscess of buttock: Secondary | ICD-10-CM | POA: Diagnosis not present

## 2021-06-01 DIAGNOSIS — L0291 Cutaneous abscess, unspecified: Secondary | ICD-10-CM | POA: Diagnosis not present

## 2021-06-15 DIAGNOSIS — F410 Panic disorder [episodic paroxysmal anxiety] without agoraphobia: Secondary | ICD-10-CM | POA: Diagnosis not present

## 2021-07-01 DIAGNOSIS — Z0000 Encounter for general adult medical examination without abnormal findings: Secondary | ICD-10-CM | POA: Diagnosis not present

## 2021-07-15 DIAGNOSIS — F410 Panic disorder [episodic paroxysmal anxiety] without agoraphobia: Secondary | ICD-10-CM | POA: Diagnosis not present

## 2021-07-19 DIAGNOSIS — Z800 Family history of malignant neoplasm of digestive organs: Secondary | ICD-10-CM | POA: Diagnosis not present

## 2021-07-19 DIAGNOSIS — R195 Other fecal abnormalities: Secondary | ICD-10-CM | POA: Diagnosis not present

## 2021-07-19 DIAGNOSIS — I10 Essential (primary) hypertension: Secondary | ICD-10-CM | POA: Diagnosis not present

## 2021-08-11 DIAGNOSIS — Z1211 Encounter for screening for malignant neoplasm of colon: Secondary | ICD-10-CM | POA: Diagnosis not present

## 2021-08-11 DIAGNOSIS — K635 Polyp of colon: Secondary | ICD-10-CM | POA: Diagnosis not present

## 2021-08-11 DIAGNOSIS — D123 Benign neoplasm of transverse colon: Secondary | ICD-10-CM | POA: Diagnosis not present

## 2021-08-11 DIAGNOSIS — R195 Other fecal abnormalities: Secondary | ICD-10-CM | POA: Diagnosis not present

## 2021-08-11 DIAGNOSIS — D122 Benign neoplasm of ascending colon: Secondary | ICD-10-CM | POA: Diagnosis not present

## 2021-08-12 DIAGNOSIS — F410 Panic disorder [episodic paroxysmal anxiety] without agoraphobia: Secondary | ICD-10-CM | POA: Diagnosis not present

## 2021-10-13 DIAGNOSIS — F410 Panic disorder [episodic paroxysmal anxiety] without agoraphobia: Secondary | ICD-10-CM | POA: Diagnosis not present

## 2022-03-16 DIAGNOSIS — F410 Panic disorder [episodic paroxysmal anxiety] without agoraphobia: Secondary | ICD-10-CM | POA: Diagnosis not present

## 2022-04-26 DIAGNOSIS — Z0000 Encounter for general adult medical examination without abnormal findings: Secondary | ICD-10-CM | POA: Diagnosis not present

## 2022-04-26 DIAGNOSIS — F419 Anxiety disorder, unspecified: Secondary | ICD-10-CM | POA: Diagnosis not present

## 2022-04-26 DIAGNOSIS — D509 Iron deficiency anemia, unspecified: Secondary | ICD-10-CM | POA: Diagnosis not present

## 2022-04-26 DIAGNOSIS — E782 Mixed hyperlipidemia: Secondary | ICD-10-CM | POA: Diagnosis not present

## 2022-04-26 DIAGNOSIS — I10 Essential (primary) hypertension: Secondary | ICD-10-CM | POA: Diagnosis not present

## 2022-04-26 DIAGNOSIS — M1712 Unilateral primary osteoarthritis, left knee: Secondary | ICD-10-CM | POA: Diagnosis not present

## 2022-04-26 DIAGNOSIS — E042 Nontoxic multinodular goiter: Secondary | ICD-10-CM | POA: Diagnosis not present

## 2022-04-26 DIAGNOSIS — Z23 Encounter for immunization: Secondary | ICD-10-CM | POA: Diagnosis not present

## 2022-08-11 DIAGNOSIS — F410 Panic disorder [episodic paroxysmal anxiety] without agoraphobia: Secondary | ICD-10-CM | POA: Diagnosis not present

## 2022-10-10 DIAGNOSIS — F410 Panic disorder [episodic paroxysmal anxiety] without agoraphobia: Secondary | ICD-10-CM | POA: Diagnosis not present

## 2022-10-21 DIAGNOSIS — N952 Postmenopausal atrophic vaginitis: Secondary | ICD-10-CM | POA: Diagnosis not present

## 2022-11-04 ENCOUNTER — Emergency Department (HOSPITAL_BASED_OUTPATIENT_CLINIC_OR_DEPARTMENT_OTHER): Payer: Medicare PPO

## 2022-11-04 ENCOUNTER — Emergency Department (HOSPITAL_BASED_OUTPATIENT_CLINIC_OR_DEPARTMENT_OTHER)
Admission: EM | Admit: 2022-11-04 | Discharge: 2022-11-04 | Disposition: A | Discharge status: Home / Self Care | Payer: Medicare PPO | Attending: Emergency Medicine | Admitting: Emergency Medicine

## 2022-11-04 ENCOUNTER — Encounter (HOSPITAL_BASED_OUTPATIENT_CLINIC_OR_DEPARTMENT_OTHER): Payer: Self-pay | Admitting: Emergency Medicine

## 2022-11-04 ENCOUNTER — Emergency Department (HOSPITAL_BASED_OUTPATIENT_CLINIC_OR_DEPARTMENT_OTHER): Payer: Medicare PPO | Admitting: Radiology

## 2022-11-04 ENCOUNTER — Other Ambulatory Visit: Payer: Self-pay

## 2022-11-04 DIAGNOSIS — I10 Essential (primary) hypertension: Secondary | ICD-10-CM | POA: Insufficient documentation

## 2022-11-04 DIAGNOSIS — Z1152 Encounter for screening for COVID-19: Secondary | ICD-10-CM | POA: Diagnosis not present

## 2022-11-04 DIAGNOSIS — M47812 Spondylosis without myelopathy or radiculopathy, cervical region: Secondary | ICD-10-CM | POA: Diagnosis not present

## 2022-11-04 DIAGNOSIS — M542 Cervicalgia: Secondary | ICD-10-CM | POA: Diagnosis not present

## 2022-11-04 DIAGNOSIS — E860 Dehydration: Secondary | ICD-10-CM

## 2022-11-04 DIAGNOSIS — R509 Fever, unspecified: Secondary | ICD-10-CM | POA: Diagnosis not present

## 2022-11-04 DIAGNOSIS — M4722 Other spondylosis with radiculopathy, cervical region: Secondary | ICD-10-CM

## 2022-11-04 DIAGNOSIS — R42 Dizziness and giddiness: Secondary | ICD-10-CM | POA: Insufficient documentation

## 2022-11-04 DIAGNOSIS — R110 Nausea: Secondary | ICD-10-CM | POA: Diagnosis not present

## 2022-11-04 DIAGNOSIS — R519 Headache, unspecified: Secondary | ICD-10-CM | POA: Diagnosis not present

## 2022-11-04 DIAGNOSIS — R079 Chest pain, unspecified: Secondary | ICD-10-CM | POA: Diagnosis not present

## 2022-11-04 DIAGNOSIS — M5412 Radiculopathy, cervical region: Secondary | ICD-10-CM | POA: Diagnosis not present

## 2022-11-04 LAB — CBC WITH DIFFERENTIAL/PLATELET
Abs Immature Granulocytes: 0.02 10*3/uL (ref 0.00–0.07)
Basophils Absolute: 0 10*3/uL (ref 0.0–0.1)
Basophils Relative: 0 %
Eosinophils Absolute: 0 10*3/uL (ref 0.0–0.5)
Eosinophils Relative: 0 %
HCT: 39.4 % (ref 36.0–46.0)
Hemoglobin: 13.3 g/dL (ref 12.0–15.0)
Immature Granulocytes: 0 %
Lymphocytes Relative: 13 %
Lymphs Abs: 0.9 10*3/uL (ref 0.7–4.0)
MCH: 30.9 pg (ref 26.0–34.0)
MCHC: 33.8 g/dL (ref 30.0–36.0)
MCV: 91.6 fL (ref 80.0–100.0)
Monocytes Absolute: 0.4 10*3/uL (ref 0.1–1.0)
Monocytes Relative: 6 %
Neutro Abs: 5.2 10*3/uL (ref 1.7–7.7)
Neutrophils Relative %: 81 %
Platelets: 174 10*3/uL (ref 150–400)
RBC: 4.3 MIL/uL (ref 3.87–5.11)
RDW: 14 % (ref 11.5–15.5)
WBC: 6.5 10*3/uL (ref 4.0–10.5)
nRBC: 0 % (ref 0.0–0.2)

## 2022-11-04 LAB — URINALYSIS, ROUTINE W REFLEX MICROSCOPIC
Bilirubin Urine: NEGATIVE
Glucose, UA: NEGATIVE mg/dL
Hgb urine dipstick: NEGATIVE
Leukocytes,Ua: NEGATIVE
Nitrite: NEGATIVE
Protein, ur: NEGATIVE mg/dL
Specific Gravity, Urine: 1.01 (ref 1.005–1.030)
pH: 7 (ref 5.0–8.0)

## 2022-11-04 LAB — RESP PANEL BY RT-PCR (RSV, FLU A&B, COVID)  RVPGX2
Influenza A by PCR: NEGATIVE
Influenza B by PCR: NEGATIVE
Resp Syncytial Virus by PCR: NEGATIVE
SARS Coronavirus 2 by RT PCR: NEGATIVE

## 2022-11-04 LAB — COMPREHENSIVE METABOLIC PANEL
ALT: 10 U/L (ref 0–44)
AST: 17 U/L (ref 15–41)
Albumin: 4.4 g/dL (ref 3.5–5.0)
Alkaline Phosphatase: 46 U/L (ref 38–126)
Anion gap: 10 (ref 5–15)
BUN: 14 mg/dL (ref 8–23)
CO2: 26 mmol/L (ref 22–32)
Calcium: 10 mg/dL (ref 8.9–10.3)
Chloride: 98 mmol/L (ref 98–111)
Creatinine, Ser: 0.96 mg/dL (ref 0.44–1.00)
GFR, Estimated: >60 mL/min (ref >60)
Glucose, Bld: 109 mg/dL — ABNORMAL HIGH (ref 70–99)
Potassium: 3.7 mmol/L (ref 3.5–5.1)
Sodium: 134 mmol/L — ABNORMAL LOW (ref 135–145)
Total Bilirubin: 1.2 mg/dL (ref 0.3–1.2)
Total Protein: 7.7 g/dL (ref 6.5–8.1)

## 2022-11-04 LAB — TROPONIN I (HIGH SENSITIVITY): Troponin I (High Sensitivity): <2 ng/L (ref <18)

## 2022-11-04 MED ORDER — LACTATED RINGERS IV BOLUS
1000.0000 mL | Freq: Once | INTRAVENOUS | Status: AC
  Administered 2022-11-04: 1000 mL via INTRAVENOUS

## 2022-11-04 MED ORDER — ACETAMINOPHEN 325 MG PO TABS
650.0000 mg | ORAL_TABLET | Freq: Once | ORAL | Status: AC
  Administered 2022-11-04: 650 mg via ORAL
  Filled 2022-11-04: qty 2

## 2022-11-04 MED ORDER — KETOROLAC TROMETHAMINE 15 MG/ML IJ SOLN
15.0000 mg | Freq: Once | INTRAMUSCULAR | Status: AC
  Administered 2022-11-04: 15 mg via INTRAVENOUS
  Filled 2022-11-04: qty 1

## 2022-11-04 MED ORDER — METHOCARBAMOL 500 MG PO TABS
500.0000 mg | ORAL_TABLET | Freq: Once | ORAL | Status: AC
  Administered 2022-11-04: 500 mg via ORAL
  Filled 2022-11-04: qty 1

## 2022-11-04 MED ORDER — LIDOCAINE 4 % EX PTCH: 1.0000 | MEDICATED_PATCH | CUTANEOUS | 0 refills | Status: DC

## 2022-11-04 MED ORDER — NAPROXEN 375 MG PO TABS: 375.0000 mg | ORAL_TABLET | Freq: Two times a day (BID) | ORAL | 0 refills | Status: DC

## 2022-11-04 MED ORDER — LIDOCAINE 5 % EX PTCH
3.0000 | MEDICATED_PATCH | CUTANEOUS | Status: DC
  Administered 2022-11-04: 3 via TRANSDERMAL
  Filled 2022-11-04: qty 3

## 2022-11-04 MED ORDER — METHOCARBAMOL 500 MG PO TABS: 500.0000 mg | ORAL_TABLET | Freq: Two times a day (BID) | ORAL | 0 refills | Status: DC | PRN

## 2022-11-04 NOTE — ED Notes (Signed)
Patient verbalizes understanding of discharge instructions. Opportunity for questioning and answers were provided. Patient discharged from ED.  °

## 2022-11-04 NOTE — ED Provider Notes (Signed)
[place] EMERGENCY DEPARTMENT AT [facility] Provider Note   CSN: [id number] Arrival date & time: 11/04/22  1246     History  Chief Complaint  Patient presents with   Torticollis   Back Pain   Shoulder Pain    Brittany Collier is a 72 y.o. female.  With PMH of HTN, anxiety presenting with atraumatic neck pain and shoulder pain x 4 days.  Patient woke up about 4 days ago with some initial pain in her left cervical paraspinal region which has then progressed to the right-sided cervical paraspinal region her upper trapezius and upper back and going down her left arm.  It is worse with any certain movements.  It is better with resting and not moving.  She has had no recent injuries or falls.  She has had no fevers, no confusion, no slurred speech, no focal weakness, no rash, no cough, no chest pain shortness of breath, no HA.  She was having an episode of dizziness and lightheadedness today where she was talking to her husband and zoned out for a brief period of time.  There was no seizure activity.  They recently went to the beach about a week or so ago and came back and has been having symptoms since and was nervous she got an infection.  She has been only taking Tylenol and using hot compresses with relief but then pain comes back.  She is currently being worked up for suspected rheumatoid arthritis.     Back Pain Shoulder Pain Associated symptoms: back pain        Home Medications Prior to Admission medications   Medication Sig Start Date End Date Taking? Authorizing Provider  ALPRAZolam (XANAX XR) 1 MG 24 hr tablet Take 1 mg 2 (two) times daily by mouth. MORNING and EVENING    [provider]  ALPRAZolam (XANAX) 0.5 MG tablet Take 0.5 mg 2 (two) times daily by mouth. MORNING and BEDTIME    [provider]  hydrochlorothiazide (HYDRODIURIL) 25 MG tablet Take 25 mg by mouth daily. 09/17/14   [provider]  lidocaine 4 % Place 1 patch onto the skin  daily. 11/04/22  Yes Mardene Sayer, MD  Melatonin 5 MG TABS Take 10 mg at bedtime by mouth.    [provider]  methocarbamol (ROBAXIN) 500 MG tablet Take 1 tablet (500 mg total) by mouth 2 (two) times daily as needed for muscle spasms. 11/04/22  Yes Mardene Sayer, MD  naproxen (NAPROSYN) 375 MG tablet Take 1 tablet (375 mg total) by mouth 2 (two) times daily. 11/04/22  Yes Mardene Sayer, MD  potassium chloride (MICRO-K) 10 MEQ CR capsule Take 10 mEq daily by mouth.     [provider]      Allergies    Penicillins    Review of Systems   Review of Systems  Musculoskeletal:  Positive for back pain.    Physical Exam Updated Vital Signs BP (!) 146/92   Pulse (!) 58   Temp 97.8 F (36.6 C)   Resp 18   Ht 5\' 4"  (1.626 m)   Wt 68 kg   SpO2 100%   BMI 25.75 kg/m  Physical Exam Constitutional: Alert and oriented. Well appearing and in no distress. Eyes: Conjunctivae are normal. ENT      Head: Normocephalic and atraumatic.      Nose: No congestion.      Neck: +Spurlings test, no midline ttp stepoff or deformity. Bilateral cervical paraspinal  ttp, no external evidence of trauma, no meningismus present Cardiovascular: S1, S2,  Normal and symmetric distal pulses are present in all extremities.Warm and well perfused. Respiratory: Normal respiratory effort. Breath sounds are normal.  O2 sat 100 on RA Gastrointestinal: Nondistended Musculoskeletal: Normal range of motion in all extremities.  Tenderness to palpation of the upper mid and bilateral trapezius without external evidence of trauma.  Full range of motion of bilateral upper extremities intact and shoulders without tenderness. Neurologic: Normal speech and language.  CN II through XII grossly intact.  Normal finger-nose bilaterally.  Full strength bilateral upper and lower extremities 5 out of 5.  Sensation grossly intact.  No gross focal neurologic deficits are appreciated. Skin: Skin is warm, dry and  intact. No rash noted. Psychiatric: Mood and affect are normal. Speech and behavior are normal.  ED Results / Procedures / Treatments   Labs (all labs ordered are listed, but only abnormal results are displayed) Labs Reviewed  COMPREHENSIVE METABOLIC PANEL - Abnormal; Notable for the following components:      Result Value   Sodium 134 (*)    Glucose, Bld 109 (*)    All other components within normal limits  URINALYSIS, ROUTINE W REFLEX MICROSCOPIC - Abnormal; Notable for the following components:   Color, Urine COLORLESS (*)    Ketones, ur TRACE (*)    All other components within normal limits  RESP PANEL BY RT-PCR (RSV, FLU A&B, COVID)  [id number]  CBC WITH DIFFERENTIAL/PLATELET  TROPONIN I (HIGH SENSITIVITY)    EKG EKG Interpretation  Date/Time:  Friday November 04 2022 15:50:22 EDT Ventricular Rate:  58 PR Interval:  210 QRS Duration: 82 QT Interval:  418 QTC Calculation: 411 R Axis:   34 Text Interpretation: Sinus rhythm Nonspecific T abnormalities, inferior leads Confirmed by Vivien RossettiBranham, Desarai Barrack ([id number]) on 11/04/2022 3:53:16 PM  Radiology CT Cervical Spine Wo Contrast  Result Date: 11/04/2022 CLINICAL DATA:  Neck pain, back pain, radicular pain left upper extremity EXAM: CT CERVICAL SPINE WITHOUT CONTRAST TECHNIQUE: Multidetector CT imaging of the cervical spine was performed without intravenous contrast. Multiplanar CT image reconstructions were also generated. RADIATION DOSE REDUCTION: This exam was performed according to the departmental dose-optimization program which includes automated exposure control, adjustment of the mA and/or kV according to patient size and/or use of iterative reconstruction technique. COMPARISON:  None Available. FINDINGS: Alignment: Alignment of posterior margins of vertebral bodies is unremarkable. Skull base and vertebrae: No recent fracture is seen. Degenerative changes are noted, more severe at C5-C6 level. Soft tissues and spinal canal: There are  smoothly marginated calcifications in the posterior margin of thecal sac, more so at C4-C5 level. This finding may suggest previous soft tissue injury and ligament calcification. Disc levels: There is encroachment of neural foramina by bony spurs from C3 to C7 levels, more severe on the left side at C4-C5 and C5-C6 levels. Upper chest: No acute findings are seen Other: There is inhomogeneous attenuation in thyroid. IMPRESSION: No recent fracture is seen in cervical spine. Cervical spondylosis with encroachment of neural foramina from C3-C7 levels, most severe on the left side at C4-C5 and C5-C6 levels. If clinically warranted, follow-up MRI may be considered. Electronically Signed   By: Ernie AvenaPalani  Rathinasamy M.D.   On: 11/04/2022 16:25   CT Head Wo Contrast  Result Date: 11/04/2022 CLINICAL DATA:  Dizziness, neck pain EXAM: CT HEAD WITHOUT CONTRAST TECHNIQUE: Contiguous axial images were obtained from the base of the skull through the vertex without intravenous contrast. RADIATION DOSE REDUCTION:  This exam was performed according to the departmental dose-optimization program which includes automated exposure control, adjustment of the mA and/or kV according to patient size and/or use of iterative reconstruction technique. COMPARISON:  01/27/2017 FINDINGS: Brain: No acute intracranial findings are seen. There are no signs of bleeding within the cranium. Minimal calcifications are seen in basal ganglia. There is no focal edema or mass effect. There is subtle decreased density in periventricular white matter. Vascular: There is mild ectasia and tortuosity of major intracranial arteries. Skull: No acute findings are seen. Sinuses/Orbits: Unremarkable. Other: None. IMPRESSION: No acute intracranial findings are seen in noncontrast CT brain. Electronically Signed   By: Ernie AvenaPalani  Rathinasamy M.D.   On: 11/04/2022 16:18   DG Chest 2 View  Result Date: 11/04/2022 CLINICAL DATA:  Pain EXAM: CHEST - 2 VIEW COMPARISON:  Chest  x-ray 10/23/2019 FINDINGS: The heart size and mediastinal contours are within normal limits. Both lungs are clear. The visualized skeletal structures are unremarkable. IMPRESSION: No active cardiopulmonary disease. Electronically Signed   By: Darliss CheneyAmy  Guttmann M.D.   On: 11/04/2022 16:10    Procedures Procedures    Medications Ordered in ED Medications  lidocaine (LIDODERM) 5 % 3 patch (3 patches Transdermal Patch Applied 11/04/22 1543)  ketorolac (TORADOL) 15 MG/ML injection 15 mg (15 mg Intravenous Given 11/04/22 1541)  acetaminophen (TYLENOL) tablet 650 mg (650 mg Oral Given 11/04/22 1541)  methocarbamol (ROBAXIN) tablet 500 mg (500 mg Oral Given 11/04/22 1543)  lactated ringers bolus 1,000 mL (1,000 mLs Intravenous New Bag/Given 11/04/22 1544)    ED Course/ Medical Decision Making/ A&P                             Medical Decision Making Brittany Collier is a 72 y.o. female.  With PMH of HTN, anxiety presenting with atraumatic neck pain and shoulder pain x 4 days.  Patient symptoms seems most consistent with cervical radiculopathy on the left side with associated likely paraspinal strain and upper trapezius muscle spasm.  She is neurologically intact and is overall very well-appearing.  She has no infectious symptoms, no meningismus and a normal white blood cell count 6.5 with no fevers and no hemodynamic instability I have no concern for infectious source, deep neck space infection, meningitis.  She has no cardiopulmonary symptoms, EKG with nonspecific T wave changes and undetectable troponin with no chest pain or shortness of breath, no concern for atypical ACS.  Reviewed were unremarkable she had a mild hyponatremia 134 with normal creatinine 0.96.  UA suggest some mild dehydration with ketones present no UTI.  Chest x-ray reviewed by me no pneumonia.  CT head unremarkable.  CT C-spine consistent with C3-C7 cervical spondylosis with associated encroachment of neural foramina worse on the left which is  consistent with patient's symptoms.  Patient felt much better after medications in the ER.  I did offer sending home with steroids which she declined at this time.  I provided referral to the surgical spine center.  I also provided prescriptions for Naprosyn, Robaxin and lidocaine patches.  She is following up with a rheumatoid specialist as she suspect she has rheumatoid arthritis contributing to these issues.  We did discuss strict return precautions.  She is in agreement with plan being discharged in good condition.  Amount and/or Complexity of Data Reviewed Labs: ordered. Radiology: ordered. ECG/medicine tests: ordered.  Risk OTC drugs. Prescription drug management.     Final Clinical Impression(s) / ED  Diagnoses Final diagnoses:  Dehydration  Cervical spondylosis with radiculopathy    Rx / DC Orders ED Discharge Orders          Ordered    naproxen (NAPROSYN) 375 MG tablet  2 times daily        11/04/22 1654    lidocaine 4 %  Every 24 hours        11/04/22 1654    methocarbamol (ROBAXIN) 500 MG tablet  2 times daily PRN        11/04/22 1654              Mardene Sayer, MD 11/04/22 1658

## 2022-11-04 NOTE — Discharge Instructions (Addendum)
You were seen for neck pain and upper back pain.  As we discussed, your imaging did show evidence of cervical spondylosis and your symptoms are consistent with radiculopathy or nerve pain.  These can be changes we see with aging.  There was no evidence of fractures or traumatic injuries.  The CT scan of your head was normal as well as your blood work was reassuring, x-ray of your chest and other workup.  You were a bit dehydrated and given fluids.  I have given you a referral to the surgical spine center if you choose to follow-up in the outpatient setting regarding your neck issues.  You can continue to take Tylenol and alternate heat and ice.  You can also use the lidocaine patch as prescribed, muscle relaxers called Robaxin and the Naprosyn anti-inflammatory medicine.  Come back if any severe worsening uncontrolled pain, high fevers, uncontrolled vomiting, inability to move your arms or legs, severe confusion, or any other symptoms concerning to you.

## 2022-11-04 NOTE — ED Triage Notes (Signed)
Pt arrives to ED with c/o stiff neck, upper back pain, and, left arm pain, dizziness, bilateral shoulder pain x3 days.

## 2022-11-08 DIAGNOSIS — M542 Cervicalgia: Secondary | ICD-10-CM | POA: Diagnosis not present

## 2022-11-08 DIAGNOSIS — I10 Essential (primary) hypertension: Secondary | ICD-10-CM | POA: Diagnosis not present

## 2022-11-14 DIAGNOSIS — M47812 Spondylosis without myelopathy or radiculopathy, cervical region: Secondary | ICD-10-CM | POA: Diagnosis not present

## 2022-12-06 DIAGNOSIS — Z1239 Encounter for other screening for malignant neoplasm of breast: Secondary | ICD-10-CM | POA: Diagnosis not present

## 2022-12-06 DIAGNOSIS — G8929 Other chronic pain: Secondary | ICD-10-CM | POA: Diagnosis not present

## 2022-12-06 DIAGNOSIS — M25561 Pain in right knee: Secondary | ICD-10-CM | POA: Diagnosis not present

## 2022-12-06 DIAGNOSIS — I10 Essential (primary) hypertension: Secondary | ICD-10-CM | POA: Diagnosis not present

## 2022-12-06 DIAGNOSIS — M25562 Pain in left knee: Secondary | ICD-10-CM | POA: Diagnosis not present

## 2022-12-13 ENCOUNTER — Ambulatory Visit: Payer: Medicare PPO | Attending: Internal Medicine | Admitting: Internal Medicine

## 2022-12-13 ENCOUNTER — Ambulatory Visit (INDEPENDENT_AMBULATORY_CARE_PROVIDER_SITE_OTHER): Payer: Medicare PPO

## 2022-12-13 ENCOUNTER — Encounter: Payer: Self-pay | Admitting: Internal Medicine

## 2022-12-13 ENCOUNTER — Ambulatory Visit: Payer: Medicare PPO

## 2022-12-13 VITALS — BP 166/90 | HR 50 | Resp 16 | Ht 63.5 in | Wt 176.0 lb

## 2022-12-13 DIAGNOSIS — M79642 Pain in left hand: Secondary | ICD-10-CM

## 2022-12-13 DIAGNOSIS — M170 Bilateral primary osteoarthritis of knee: Secondary | ICD-10-CM | POA: Diagnosis not present

## 2022-12-13 DIAGNOSIS — M7989 Other specified soft tissue disorders: Secondary | ICD-10-CM | POA: Diagnosis not present

## 2022-12-13 DIAGNOSIS — M79641 Pain in right hand: Secondary | ICD-10-CM

## 2022-12-13 NOTE — Progress Notes (Signed)
Office Visit Note  Patient: Brittany Collier             Date of Birth: 02-26-1951           MRN: [id number]             PCP: Henrine Screws, MD Referring: Henrine Screws, MD Visit Date: 12/13/2022   Subjective:  New Patient (Initial Visit) (Patient states she gardens and can become stiff but exercise has helped. Patient states she has pain in both knees.)   History of Present Illness: Brittany Collier is a 72 y.o. female here for evaluation of joint pain and stiffness in multiple areas.  She has longstanding arthritis symptoms problems with the left knee dating back at least 25 years.  This is usually been coming and going in severity sometimes seeing more pain during periods of increased physical activity.  Right knee pain has been more recent development but overall has several years of arthritis in multiple areas.  She has been noticing nodules swelling and some deviation in multiple finger joints started since around 5 years ago.  In the past 1 year she has noticed some worsening of changes at her right second digit especially some lateral deviation and pain at the MCP joint.  She exercises a lot and has done so for years.  Recently experiences some increased pain with prolonged walking and hand pain with gardening.  She does some water-based exercises which are helpful for her joint pain and stiffness especially the knees.  She typically manages symptoms without a lot of additional medications.  Has a family history of arthritis and suspected for rheumatoid arthritis by her PCP with the increase in inflammation and progressive joint deformity.   Activities of Daily Living:  Patient reports morning stiffness for 0 minute.   Patient Denies nocturnal pain.  Difficulty dressing/grooming: Denies Difficulty climbing stairs: Reports Difficulty getting out of chair: Reports Difficulty using hands for taps, buttons, cutlery, and/or writing: Reports  Review of Systems  Constitutional:  Positive  for fatigue.  HENT:  Negative for mouth sores and mouth dryness.   Eyes:  Negative for dryness.  Respiratory:  Negative for shortness of breath.   Cardiovascular:  Negative for chest pain and palpitations.  Gastrointestinal:  Negative for blood in stool, constipation and diarrhea.  Endocrine: Negative for increased urination.  Genitourinary:  Negative for involuntary urination.  Musculoskeletal:  Positive for joint pain, joint pain, joint swelling, muscle weakness and muscle tenderness. Negative for gait problem, myalgias, morning stiffness and myalgias.  Skin:  Negative for color change, rash, hair loss and sensitivity to sunlight.  Allergic/Immunologic: Negative for susceptible to infections.  Neurological:  Negative for dizziness and headaches.  Hematological:  Negative for swollen glands.  Psychiatric/Behavioral:  Negative for depressed mood and sleep disturbance. The patient is nervous/anxious.     PMFS History:  Patient Active Problem List   Diagnosis Date Noted   Finger swelling 12/13/2022   Bilateral primary osteoarthritis of knee 12/13/2022   Chest pain 01/27/2017    Past Medical History:  Diagnosis Date   Anxiety    Hypertension     Family History  Problem Relation Age of Onset   Arthritis Mother    Diabetes Mother    Past Surgical History:  Procedure Laterality Date   HAND SURGERY Right    finger release   Social History   Social History Narrative   Not on file    There is no immunization history on file for  this patient.   Objective: Vital Signs: BP (!) 166/90 (BP Location: Right Arm, Patient Position: Sitting, Cuff Size: Normal)   Pulse (!) 50   Resp 16   Ht 5' 3.5" (1.613 m)   Wt 176 lb (79.8 kg)   BMI 30.69 kg/m    Physical Exam Cardiovascular:     Rate and Rhythm: Normal rate and regular rhythm.  Pulmonary:     Effort: Pulmonary effort is normal.     Breath sounds: Normal breath sounds.  Musculoskeletal:     Right lower leg: No edema.      Left lower leg: No edema.  Skin:    General: Skin is warm and dry.  Neurological:     Mental Status: She is alert.  Psychiatric:        Mood and Affect: Mood normal.      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  Imaging: XR Hand 2 View Left  Result Date: 12/30/2022 X-ray left hand 2 views Narrowing of radiocarpal joint space worse along the radial border.  Severe first CMC joint degenerative arthritis with joint space loss severe subluxation and compensatory hyperextension of first MCP joint.  Joint space narrowing and subluxation with widening of the second metacarpal head.  PIP joints appear normal there is mild joint space narrowing at multiple DIPs and periarticular calcification at second and third DIPs.  No joint erosions seen. Impression Severe first CMC joint osteoarthritis, mild distal osteoarthritis with second MCP joint changes suspicious for secondary process but with no erosions or focal demineralization  XR Hand 2 View Right  Result Date: 12/30/2022 X-ray right hand 2 views Radiocarpal joint space appears normal.  There are few cystic changes present in the carpal bones.  Advanced first [facility] joint degenerative arthritis with severe joint space loss and joint subluxation.  Second  and third MCP joint space narrowing and subluxation with hook osteophyte process at the third metacarpal head.  Third DIP joint degenerative changes with narrowing and osteophyte more on medial border.  No joint erosions seen. Impression Severe first CMC joint osteoarthritis degenerative changes most advanced in the third digit suspicious for a secondary process with subluxation and hook osteophytes but no erosions or focal demineralization seen  MM 3D SCREENING MAMMOGRAM BILATERAL BREAST  Result Date: 12/16/2022 CLINICAL DATA:  Screening. EXAM: DIGITAL SCREENING BILATERAL MAMMOGRAM WITH TOMOSYNTHESIS AND CAD TECHNIQUE: Bilateral screening digital craniocaudal and mediolateral oblique mammograms were obtained. Bilateral screening digital breast tomosynthesis was performed. The images were evaluated with computer-aided detection. COMPARISON:  Previous exam(s). ACR Breast Density Category b: There are scattered areas of fibroglandular density. FINDINGS: In the right breast, a possible mass warrants further evaluation. In the left breast, no findings suspicious for malignancy. IMPRESSION: Further evaluation is suggested for a possible mass in the right breast. RECOMMENDATION: Diagnostic mammogram and possibly ultrasound of the right breast. (Code:[id number]-33M) The patient  will be contacted regarding the findings, and additional imaging will be scheduled. BI-RADS CATEGORY  0: Incomplete: Need additional imaging evaluation. Electronically Signed   By: Harmon Pier M.D.   On: 12/16/2022 08:31    Recent Labs: Lab Results  Component Value Date   WBC 6.5 11/04/2022   HGB 13.3 11/04/2022   PLT 174 11/04/2022   NA 134 (L) 11/04/2022   K 3.7 11/04/2022   CL 98 11/04/2022   CO2 26 11/04/2022   GLUCOSE 109 (H) 11/04/2022   BUN 14 11/04/2022   CREATININE 0.96 11/04/2022   BILITOT 1.2 11/04/2022   ALKPHOS 46 11/04/2022   AST 17 11/04/2022   ALT 10 11/04/2022   PROT 7.7 11/04/2022   ALBUMIN 4.4 11/04/2022    CALCIUM 10.0 11/04/2022   GFRAA 55 (L) 06/06/2017    Speciality Comments: No specialty comments available.  Procedures:  No procedures performed Allergies: Penicillins   Assessment / Plan:     Visit Diagnoses: Bilateral hand pain  Finger swelling - Plan: XR Hand 2 View Right, XR Hand 2 View Left, Rheumatoid factor, Cyclic citrul peptide antibody, IgG, Sedimentation rate, C-reactive protein  Hand pain with few deformities including lateral deviation but no palpable synovitis is present on exam today.  Check x-rays of bilateral hands there is severe osteoarthritis in the thumbs, hooked osteophytes with degenerative arthritis at the MCP joint looks consistent for changes of longstanding rheumatoid arthritis but no erosive disease.  Checking sed rate and CRP for evaluation of current inflammation and disease activity.  Checking rheumatoid factor and CCP antibodies.  Overall I do not see much inflammation that would necessarily account for current joint pain so might benefit initially with symptomatic management versus starting long-term RA medications if there is definite active disease.  Bilateral primary osteoarthritis of knee  Knee pain with known degenerative arthritis and no effusion on exam today.  I think physical therapy would be a good option for treatment also to recommend if any specific changes to her home exercises would be ideal.  She had some interest in [facility] for aqua therapy option.  Orders: Orders Placed This Encounter  Procedures   XR Hand 2 View Right   XR Hand 2 View Left   Rheumatoid factor   Cyclic citrul peptide antibody, IgG   Sedimentation rate   C-reactive protein   No orders of the defined types were placed in this encounter.    Follow-Up Instructions: No follow-ups on file.   Fuller Plan, MD  Note - This record has been created using [employer].  Chart creation errors have been sought, but may not always  have been located. Such  creation errors do not reflect on  the standard of medical care.

## 2022-12-13 NOTE — Patient Instructions (Signed)

## 2022-12-14 ENCOUNTER — Other Ambulatory Visit: Payer: Self-pay | Admitting: Family Medicine

## 2022-12-14 ENCOUNTER — Ambulatory Visit
Admission: RE | Admit: 2022-12-14 | Discharge: 2022-12-14 | Disposition: A | Discharge status: Home / Self Care | Payer: Medicare PPO | Source: Ambulatory Visit | Attending: Family Medicine | Admitting: Family Medicine

## 2022-12-14 DIAGNOSIS — Z1239 Encounter for other screening for malignant neoplasm of breast: Secondary | ICD-10-CM

## 2022-12-14 DIAGNOSIS — Z1231 Encounter for screening mammogram for malignant neoplasm of breast: Secondary | ICD-10-CM | POA: Diagnosis not present

## 2022-12-14 LAB — RHEUMATOID FACTOR: Rheumatoid fact SerPl-aCnc: 22 IU/mL — ABNORMAL HIGH (ref <14)

## 2022-12-15 LAB — C-REACTIVE PROTEIN: CRP: <3 mg/L (ref <8.0)

## 2022-12-15 LAB — SEDIMENTATION RATE: Sed Rate: 2 mm/h (ref 0–30)

## 2022-12-15 LAB — CYCLIC CITRUL PEPTIDE ANTIBODY, IGG: Cyclic Citrullin Peptide Ab: <16 UNITS

## 2022-12-19 ENCOUNTER — Other Ambulatory Visit: Payer: Self-pay | Admitting: Family Medicine

## 2022-12-19 DIAGNOSIS — R928 Other abnormal and inconclusive findings on diagnostic imaging of breast: Secondary | ICD-10-CM

## 2023-01-02 ENCOUNTER — Ambulatory Visit
Admission: RE | Admit: 2023-01-02 | Discharge: 2023-01-02 | Disposition: A | Discharge status: Home / Self Care | Payer: Medicare PPO | Source: Ambulatory Visit | Attending: Family Medicine | Admitting: Family Medicine

## 2023-01-02 ENCOUNTER — Other Ambulatory Visit: Payer: Self-pay | Admitting: *Deleted

## 2023-01-02 ENCOUNTER — Other Ambulatory Visit: Payer: Self-pay | Admitting: Family Medicine

## 2023-01-02 DIAGNOSIS — R922 Inconclusive mammogram: Secondary | ICD-10-CM | POA: Diagnosis not present

## 2023-01-02 DIAGNOSIS — R928 Other abnormal and inconclusive findings on diagnostic imaging of breast: Secondary | ICD-10-CM

## 2023-01-02 DIAGNOSIS — N63 Unspecified lump in unspecified breast: Secondary | ICD-10-CM | POA: Diagnosis not present

## 2023-01-02 DIAGNOSIS — R599 Enlarged lymph nodes, unspecified: Secondary | ICD-10-CM

## 2023-01-02 DIAGNOSIS — R591 Generalized enlarged lymph nodes: Secondary | ICD-10-CM | POA: Diagnosis not present

## 2023-01-02 DIAGNOSIS — N6310 Unspecified lump in the right breast, unspecified quadrant: Secondary | ICD-10-CM

## 2023-01-02 NOTE — Addendum Note (Signed)
Addended by: Fuller Plan on: 01/02/2023 11:49 AM   Modules accepted: Orders

## 2023-01-03 ENCOUNTER — Telehealth: Payer: Self-pay | Admitting: Internal Medicine

## 2023-01-03 ENCOUNTER — Encounter: Payer: Self-pay | Admitting: *Deleted

## 2023-01-03 NOTE — Telephone Encounter (Signed)
Patient called stating she would like "a written summary with the information that she was given over the phone by the nurse on Monday, 01/02/23."  Patient requested a return call.

## 2023-01-03 NOTE — Telephone Encounter (Signed)
Called patient and she is okay receiving information via my chart. Message sent to patient.

## 2023-01-04 ENCOUNTER — Ambulatory Visit
Admission: RE | Admit: 2023-01-04 | Discharge: 2023-01-04 | Disposition: A | Discharge status: Home / Self Care | Payer: Medicare PPO | Source: Ambulatory Visit | Attending: Family Medicine | Admitting: Family Medicine

## 2023-01-04 DIAGNOSIS — N63 Unspecified lump in unspecified breast: Secondary | ICD-10-CM | POA: Diagnosis not present

## 2023-01-04 DIAGNOSIS — N6310 Unspecified lump in the right breast, unspecified quadrant: Secondary | ICD-10-CM

## 2023-01-04 DIAGNOSIS — R928 Other abnormal and inconclusive findings on diagnostic imaging of breast: Secondary | ICD-10-CM

## 2023-01-04 DIAGNOSIS — R599 Enlarged lymph nodes, unspecified: Secondary | ICD-10-CM

## 2023-01-04 DIAGNOSIS — Z170 Estrogen receptor positive status [ER+]: Secondary | ICD-10-CM | POA: Diagnosis not present

## 2023-01-04 DIAGNOSIS — D0511 Intraductal carcinoma in situ of right breast: Secondary | ICD-10-CM | POA: Diagnosis not present

## 2023-01-04 DIAGNOSIS — C50411 Malignant neoplasm of upper-outer quadrant of right female breast: Secondary | ICD-10-CM | POA: Diagnosis not present

## 2023-01-04 HISTORY — PX: BREAST BIOPSY: SHX20

## 2023-01-06 ENCOUNTER — Telehealth: Payer: Self-pay | Admitting: Hematology

## 2023-01-06 NOTE — Telephone Encounter (Signed)
Spoke to patient to confirm upcoming afternoon [facility] clinic appointment on 6/12, paperwork will be sent via mail.   Gave location and time, also informed patient that the surgeon's office would be calling as well to get information from them similar to the packet that they will be receiving so make sure to do both.  Reminded patient that all providers will be coming to the clinic to see them HERE and if they had any questions to not hesitate to reach back out to myself or their navigators. 

## 2023-01-09 ENCOUNTER — Encounter: Payer: Self-pay | Admitting: *Deleted

## 2023-01-09 DIAGNOSIS — Z170 Estrogen receptor positive status [ER+]: Secondary | ICD-10-CM | POA: Insufficient documentation

## 2023-01-10 NOTE — Progress Notes (Unsigned)
[facility]   [phone] Fax:([phone]) [phone]   Clinic New Consult Note   Patient Care Team: Henrine Screws, MD as PCP - General (Family Medicine) Harriette Bouillon, MD as Consulting Physician (General Surgery) Malachy Mood, MD as Consulting Physician (Hematology) Dorothy Puffer, MD as Consulting Physician (Radiation Oncology) Donnelly Angelica, RN as Oncology Nurse Navigator Pershing Proud, RN as Oncology Nurse Navigator  Date of Service:  01/11/2023   CHIEF COMPLAINTS/PURPOSE OF CONSULTATION:  Right BREAST CANCER  REFERRING PHYSICIAN:  [facility]   ASSESSMENT & PLAN:  Brittany Collier is a 73 y.o. post-menopausal female with a history of   1.  Malignant neoplasm of upper-outer quadrant of right breast,  invasive Ductal Carcinoma, cT1cN0M0 stage IA, grade 2, ER+/PR(+) HER2(-)  -We discussed her imaging findings and the biopsy results in great details. -Given the early stage disease, she likely need a lumpectomy. She is agreeable with that. She was seen by Dr. Luisa Hart today and likely will proceed with surgery soon.  -I recommend a Oncotype Dx test on the surgical sample and we'll make a decision about adjuvant chemotherapy based on the Oncotype result. Written material of this test was given to her. She is young and fit, would be a good candidate for chemotherapy if her Oncotype recurrence score is high. -Giving the strong ER and PR expression in her postmenopausal status, I recommend adjuvant endocrine therapy with aromatase inhibitor for a total of 5-7 years to reduce the risk of cancer recurrence. Potential benefits and side effects were discussed with patient and her husband in detail -Patient is very reluctant to take systemic treatment, due to the concern of potential side effect.  After lengthy discussion, she agreed to proceed with Oncotype on surgical sample, and see me again after radiation to review antiestrogen therapy. -She was also seen by  radiation oncologist Dr. Mitzi Hansen today. She will likely benefit from breast radiation if she undergo lumpectomy to decrease the risk of breast cancer.  -We also discussed the breast cancer surveillance after her surgery. She will continue annual screening mammogram, self exam, and a routine office visit with lab and exam with [place]. -I encouraged her to have healthy diet and exercise regularly.   2. Bone Health  -She has never had a DEXA. We will obtain one for baseline   PLAN:  -She will proceed right lumpectomy and sentinel lymph node biopsy, with removal of the abnormal lymph node in right axilla by Dr. Luisa Hart soon  -I recommend Oncotype on her surgical sample -I recommend adjuvant antiestrogen therapy, she will think about it.  She is reluctant to take. - I order Bone density  Scan to obtain a baseline -lab reviewed  -Plan to see her back at the end of her radiation, or sooner if Oncotype shows high risk disease.   Oncology History Overview Note   Cancer Staging  Malignant neoplasm of upper-outer quadrant of right breast in female, estrogen receptor positive (HCC) Staging form: Breast, AJCC 8th Edition - Clinical stage from 01/04/2023: Stage IA (cT1c, cN0, cM0, G2, ER+, PR+, HER2-) - Signed by Malachy Mood, MD on 01/10/2023 Stage prefix: Initial diagnosis Histologic grading system: 3 grade system     Malignant neoplasm of upper-outer quadrant of right breast in female, estrogen receptor positive (HCC)  01/02/2023 Imaging   MM 3D DIAGNOSTIC MAMMOGRAM UNILATERAL RIGHT BREAST     IMPRESSION: 1. Highly suspicious mass in the right breast at 10 o'clock 6 cm from the nipple measuring  1.9 cm. 2. Highly suspicious mass in the right breast at 10 o'clock 7 cm from the nipple measuring 0.9 cm. 3. Indeterminate right axillary lymph node with borderline cortical thickening.   01/04/2023 Cancer Staging   Staging form: Breast, AJCC 8th Edition - Clinical stage from 01/04/2023: Stage IA (cT1c, cN0,  cM0, G2, ER+, PR+, HER2-) - Signed by Malachy Mood, MD on 01/10/2023 Stage prefix: Initial diagnosis Histologic grading system: 3 grade system   01/05/2023 Pathology Results   Diagnosis Breast, right, needle core biopsy, 10 o'clock, 6cmfn, venus clip - INVASIVE DUCTAL CARCINOMA, SEE NOTE - DUCTAL CARCINOMA IN SITU, INTERMEDIATE GRADE - TUBULE FORMATION: SCORE 3 - NUCLEAR PLEOMORPHISM: SCORE 2 - MITOTIC COUNT: SCORE 1 - TOTAL SCORE: 6 - OVERALL GRADE: 2 - LYMPHOVASCULAR INVASION: NOT IDENTIFIED - CANCER LENGTH: 0.8 CM - CALCIFICATIONS: NOT IDENTIFIED    01/09/2023 Initial Diagnosis   Malignant neoplasm of upper-outer quadrant of right breast in female, estrogen receptor positive (HCC)      HISTORY OF PRESENTING ILLNESS: Brittany Collier 72 y.o. female is a here because of right breast cancer. The [facility]. The patient presents to the clinic today accompanied by husband.   She had routine screening mammography on 12/16/2022, showing a possible abnormality in the right breast.She underwent unilateral  diagnostic mammography and  breast ultrasonography on 01/02/2023 showing:  Highly suspicious mass in the right breast at 10 o'clock 6 cm from the nipple measuring 1.9 cm. Highly suspicious mass in the right breast at 10 o'clock 7 cm from the nipple measuring 0.9 cm, and indeterminate right axillary lymph node with borderline cortical thickening.  She underwent right breast biopsy, the axillary lymph node was not able to biopsy due to the close contact of artery.  Biopsy on 01/05/2023 showed grade 2 invasive ductal carcinoma and DCIS. Prognostic indicators significant for: estrogen receptor, 100% positive  and progesterone receptor, 100% positive . Proliferation marker Ki67 at 10%. HER2 negative.    Pt state that she felt fine prior to diagnose. Pt denies have any issues. Pt does have knee pain ,but is manageable with if she exercise.   She has a PMHx  of.... Anxiety Hypertension   Socially... Married 3 children  GYN HISTORY  Menarchal: 15 LMP: 49 YE Contraceptive: HRT:  GP:G4,P3    REVIEW OF SYSTEMS:    Constitutional: (-)Denies fevers, chills or abnormal night sweats Eyes:(-) Denies blurriness of vision, double vision or watery eyes Ears, nose, mouth, throat, and face: Denies mucositis or sore throat Respiratory:(-) Denies cough, dyspnea or wheezes Cardiovascular: (-)Denies palpitation, chest discomfort or lower extremity swelling Gastrointestinal:(-)  Denies nausea, heartburn or change in bowel habits Skin: (-)Denies abnormal skin rashes Lymphatics:(-)  Denies new lymphadenopathy or easy bruising Neurological:(-)Denies numbness, tingling or new weaknesses Behavioral/Psych: (+)Mood is stable, no new changes  All other systems were reviewed with the patient and are negative.   MEDICAL HISTORY:  Past Medical History:  Diagnosis Date   Anxiety    Hypertension     SURGICAL HISTORY: Past Surgical History:  Procedure Laterality Date   BREAST BIOPSY Right 01/04/2023   [place] RT BREAST BX W LOC DEV 1ST LESION IMG BX SPEC US GUIDE 01/04/2023 [facility] MAMMOGRAP[facility]   HAND SURGERY Right    finger release    SOCIAL HISTORY: Social History   Socioeconomic History   Marital status: Married    Spouse name: Not on file   Number of children: 3   Years of education:  Not on file   Highest education level: Not on file  Occupational History   Not on file  Tobacco Use   Smoking status: Never    Passive exposure: Never   Smokeless tobacco: Never  Vaping Use   Vaping Use: Never used  Substance and Sexual Activity   Alcohol use: Yes    Comment: rarely   Drug use: Never   Sexual activity: Not on file  Other Topics Concern   Not on file  Social History Narrative   Not on file   Social Determinants of Health   Financial Resource Strain: Not on file  Food Insecurity: Not on file  Transportation Needs: Not on file  Physical  Activity: Not on file  Stress: Not on file  Social Connections: Not on file  Intimate Partner Violence: Not on file    FAMILY HISTORY: Family History  Problem Relation Age of Onset   Arthritis Mother    Diabetes Mother     ALLERGIES:  is allergic to penicillins.  MEDICATIONS:  Current Outpatient Medications  Medication Sig Dispense Refill   ALPRAZolam (XANAX XR) 1 MG 24 hr tablet Take 1 mg 2 (two) times daily by mouth. MORNING and EVENING     ALPRAZolam (XANAX) 0.5 MG tablet Take 0.5 mg by mouth 2 (two) times daily. MORNING and BEDTIME     amLODipine (NORVASC) 2.5 MG tablet Take 2.5 mg by mouth daily.     hydrochlorothiazide (HYDRODIURIL) 25 MG tablet Take 25 mg by mouth daily.  0   Melatonin 5 MG TABS Take 10 mg at bedtime by mouth.     No current facility-administered medications for this visit.    PHYSICAL EXAMINATION: ECOG PERFORMANCE STATUS: 0 - Asymptomatic  Vitals:   01/11/23 1238  BP: (!) 143/89  Pulse: (!) 57  Resp: 18  Temp: 98.3 F (36.8 C)  SpO2: 100%   Filed Weights   01/11/23 1238  Weight: 173 lb 12.8 oz (78.8 kg)     GENERAL:alert, no distress and comfortable SKIN: skin color normal, no rashes or significant lesions EYES: normal, Conjunctiva are pink and non-injected, sclera clear  NEURO: alert & oriented x 3 with fluent speech LYMPH: (-) no palpable lymphadenopathy in the cervical, axillary  LUNGS: clear to auscultation and percussion with normal breathing effort HEART: regular rate & rhythm and no murmurs and no lower extremity edema ABDOMEN:(-) abdomen soft, non-tender and(-) normal bowel sounds BREAST:   RT breast No palpable mass, nodules or adenopathy bilaterally. Breast exam benign. Lt breast no palpable mass breast exam benign  LABORATORY DATA:  I have reviewed the data as listed    Latest Ref Rng & Units 01/11/2023   12:23 PM 11/04/2022    1:08 PM 06/06/2017    7:21 PM  CBC  WBC 4.0 - 10.5 K/uL 3.7  6.5  6.4   Hemoglobin 12.0 - 15.0  g/dL 16.1  09.6  04.5   Hematocrit 36.0 - 46.0 % 38.7  39.4  36.1   Platelets 150 - 400 K/uL 199  174  211        Latest Ref Rng & Units 01/11/2023   12:23 PM 11/04/2022    1:08 PM 06/06/2017    7:21 PM  CMP  Glucose 70 - 99 mg/dL 409  811  88   BUN 8 - 23 mg/dL 20  14  18    Creatinine 0.44 - 1.00 mg/dL 9.14  7.82  9.56   Sodium 135 - 145 mmol/L 135  134  133   Potassium 3.5 - 5.1 mmol/L 3.8  3.7  3.2   Chloride 98 - 111 mmol/L 101  98  98   CO2 22 - 32 mmol/L 28  26  23    Calcium 8.9 - 10.3 mg/dL 40.9  81.1  9.8   Total Protein 6.5 - 8.1 g/dL 7.5  7.7    Total Bilirubin 0.3 - 1.2 mg/dL 0.9  1.2    Alkaline Phos 38 - 126 U/L 46  46    AST 15 - 41 U/L 17  17    ALT 0 - 44 U/L 9  10       RADIOGRAPHIC STUDIES: I have personally reviewed the radiological images as listed and agreed with the findings in the report. [place] RT BREAST BX W LOC DEV 1ST LESION IMG BX SPEC US GUIDE  Addendum Date: 01/05/2023   ADDENDUM REPORT: 01/05/2023 12:42 ADDENDUM: PATHOLOGY revealed: Site Breast, right, needle core biopsy, 10 o'clock, 6 cm fn, venus clip - INVASIVE DUCTAL CARCINOMA, SEE NOTE - DUCTAL CARCINOMA IN SITU, INTERMEDIATE GRADE - TUBULE FORMATION: SCORE 3 NUCLEAR PLEOMORPHISM: SCORE 2 - MITOTIC COUNT: SCORE 1 - TOTAL SCORE: 6 - OVERALL GRADE: 2 - LYMPHOVASCULAR INVASION: NOT IDENTIFIED - CANCER LENGTH: 0.8 CM CALCIFICATIONS: NOT IDENTIFIED Pathology results are CONCORDANT with imaging findings, per Dr. Amie Portland. Pathology results and recommendations below were discussed with patient by telephone on 01/05/2023. Patient reported biopsy site within normal limits with slight tenderness at the site. Post biopsy care instructions were reviewed, questions were answered and my direct phone number was provided to patient. Patient was instructed to call [facility] if any concerns or questions arise related to the biopsy. The patient was referred to [facility] at [facility] with appointment on 01/11/2023. Pathology results reported by Lynett Grimes, RN on 01/05/2023. Electronically Signed   By: Amie Portland M.D.   On: 01/05/2023 12:42   Result Date: 01/05/2023 CLINICAL DATA:  Patient presents for ultrasound-guided core needle biopsy of a right breast mass. On diagnostic imaging, there actually 2 adjacent masses at 10 o'clock 6 and 7 cm from the nipple. Only the mass at 6 cm from the nipple could be confidently visualized at the time of biopsy. There is also a borderline abnormal right axillary lymph node, cortical thickness of 3.5 mm. This could be visualized, but is in the deep axilla in close proximity to the axillary artery. For this reason biopsy was not attempted. EXAM: ULTRASOUND GUIDED RIGHT BREAST CORE NEEDLE BIOPSY COMPARISON:  Previous exam(s). PROCEDURE: I met with the patient and we discussed the procedure of ultrasound-guided biopsy, including benefits and alternatives. We discussed the high likelihood of a successful procedure. We discussed the risks of the procedure, including infection, bleeding, tissue injury, clip migration, and inadequate sampling. Informed written consent was given. The usual time-out protocol was performed immediately prior to the procedure. Lesion quadrant: Upper outer quadrant Using sterile technique and 1% Lidocaine as local anesthetic, under direct ultrasound visualization, a 12 gauge spring-loaded device was used to perform biopsy of the irregular hypoechoic mass at 10 o'clock, 6 cm the nipple, using an inferior approach. At the conclusion of the procedure a venous shaped tissue marker clip was deployed into the biopsy cavity. Follow up 2 view mammogram was performed and dictated separately. IMPRESSION: Ultrasound guided biopsy of a right breast mass. No apparent complications. Electronically Signed: By: Amie Portland M.D. On:  01/04/2023 14:52  MM CLIP PLACEMENT RIGHT  Result  Date: 01/04/2023 CLINICAL DATA:  Assess post biopsy marker clip placement following ultrasound-guided core needle biopsy of a right breast mass. EXAM: 3D DIAGNOSTIC RIGHT MAMMOGRAM POST ULTRASOUND BIOPSY COMPARISON:  Previous exam(s). FINDINGS: 3D Mammographic images were obtained following ultrasound guided biopsy of a right breast mass. The biopsy marking clip is in expected position at the site of biopsy. IMPRESSION: Appropriate positioning of the venous shaped biopsy marking clip at the site of biopsy in the within the ill-defined mass in the upper outer right breast. Final Assessment: Post Procedure Mammograms for Marker Placement Electronically Signed   By: Amie Portland M.D.   On: 01/04/2023 15:08  MM 3D DIAGNOSTIC MAMMOGRAM UNILATERAL RIGHT BREAST  Result Date: 01/02/2023 CLINICAL DATA:  71 year old female presenting as a recall from screening for possible right breast mass. EXAM: DIGITAL DIAGNOSTIC UNILATERAL RIGHT MAMMOGRAM WITH TOMOSYNTHESIS; ULTRASOUND RIGHT BREAST LIMITED TECHNIQUE: Right digital diagnostic mammography and breast tomosynthesis was performed.; Targeted ultrasound examination of the right breast was performed COMPARISON:  Previous exam(s). ACR Breast Density Category b: There are scattered areas of fibroglandular density. FINDINGS: Mammogram: Right breast: Spot compression tomosynthesis and 2D spot magnification views of the right breast were performed demonstrating an irregular mass in the upper outer right breast measuring approximately 1.6 cm. There is another possible mass located posterior to this also in the upper outer right breast with a few calcifications. On physical exam I feel a discrete mass in the upper outer right breast. Ultrasound: Targeted ultrasound is performed in the right breast at 10 o'clock 6 cm from the nipple demonstrating an irregular hypoechoic mass measuring 1.9 x 1.1 x 1.5 cm. At 10 o'clock 7 cm from the nipple there is an irregular hypoechoic mass  measuring 0.7 x 0.9 x 0.5 cm. These correspond to the mammographic findings. In the right axilla there is 1 lymph node with borderline cortical thickening measuring 0.4 cm. The remainder of the lymph nodes have very thin cortices. IMPRESSION: 1. Highly suspicious mass in the right breast at 10 o'clock 6 cm from the nipple measuring 1.9 cm. 2. Highly suspicious mass in the right breast at 10 o'clock 7 cm from the nipple measuring 0.9 cm. 3. Indeterminate right axillary lymph node with borderline cortical thickening. RECOMMENDATION: Ultrasound-guided core needle biopsy x3 targeting the two right breast masses at 10 o'clock and the right axillary lymph node. I have discussed the findings and recommendations with the patient. If applicable, a reminder letter will be sent to the patient regarding the next appointment. BI-RADS CATEGORY  5: Highly suggestive of malignancy. Electronically Signed   By: Emmaline Kluver M.D.   On: 01/02/2023 15:27  [place] LIMITED ULTRASOUND INCLUDING AXILLA RIGHT BREAST  Result Date: 01/02/2023 CLINICAL DATA:  72 year old female presenting as a recall from screening for possible right breast mass. EXAM: DIGITAL DIAGNOSTIC UNILATERAL RIGHT MAMMOGRAM WITH TOMOSYNTHESIS; ULTRASOUND RIGHT BREAST LIMITED TECHNIQUE: Right digital diagnostic mammography and breast tomosynthesis was performed.; Targeted ultrasound examination of the right breast was performed COMPARISON:  Previous exam(s). ACR Breast Density Category b: There are scattered areas of fibroglandular density. FINDINGS: Mammogram: Right breast: Spot compression tomosynthesis and 2D spot magnification views of the right breast were performed demonstrating an irregular mass in the upper outer right breast measuring approximately 1.6 cm. There is another possible mass located posterior to this also in the upper outer right breast with a few calcifications. On physical exam I feel a discrete mass in the upper outer  right breast. Ultrasound:  Targeted ultrasound is performed in the right breast at 10 o'clock 6 cm from the nipple demonstrating an irregular hypoechoic mass measuring 1.9 x 1.1 x 1.5 cm. At 10 o'clock 7 cm from the nipple there is an irregular hypoechoic mass measuring 0.7 x 0.9 x 0.5 cm. These correspond to the mammographic findings. In the right axilla there is 1 lymph node with borderline cortical thickening measuring 0.4 cm. The remainder of the lymph nodes have very thin cortices. IMPRESSION: 1. Highly suspicious mass in the right breast at 10 o'clock 6 cm from the nipple measuring 1.9 cm. 2. Highly suspicious mass in the right breast at 10 o'clock 7 cm from the nipple measuring 0.9 cm. 3. Indeterminate right axillary lymph node with borderline cortical thickening. RECOMMENDATION: Ultrasound-guided core needle biopsy x3 targeting the two right breast masses at 10 o'clock and the right axillary lymph node. I have discussed the findings and recommendations with the patient. If applicable, a reminder letter will be sent to the patient regarding the next appointment. BI-RADS CATEGORY  5: Highly suggestive of malignancy. Electronically Signed   By: Emmaline Kluver M.D.   On: 01/02/2023 15:27  XR Hand 2 View Left  Result Date: 12/30/2022 X-ray left hand 2 views Narrowing of radiocarpal joint space worse along the radial border.  Severe first CMC joint degenerative arthritis with joint space loss severe subluxation and compensatory hyperextension of first MCP joint.  Joint space narrowing and subluxation with widening of the second metacarpal head.  PIP joints appear normal there is mild joint space narrowing at multiple DIPs and periarticular calcification at second and third DIPs.  No joint erosions seen. Impression Severe first CMC joint osteoarthritis, mild distal osteoarthritis with second MCP joint changes suspicious for secondary process but with no erosions or focal demineralization  XR Hand 2 View Right  Result Date:  12/30/2022 X-ray right hand 2 views Radiocarpal joint space appears normal.  There are few cystic changes present in the carpal bones.  Advanced first [facility] joint degenerative arthritis with severe joint space loss and joint subluxation.  Second and third MCP joint space narrowing and subluxation with hook osteophyte process at the third metacarpal head.  Third DIP joint degenerative changes with narrowing and osteophyte more on medial border.  No joint erosions seen. Impression Severe first CMC joint osteoarthritis degenerative changes most advanced in the third digit suspicious for a secondary process with subluxation and hook osteophytes but no erosions or focal demineralization seen  MM 3D SCREENING MAMMOGRAM BILATERAL BREAST  Result Date: 12/16/2022 CLINICAL DATA:  Screening. EXAM: DIGITAL SCREENING BILATERAL MAMMOGRAM WITH TOMOSYNTHESIS AND CAD TECHNIQUE: Bilateral screening digital craniocaudal and mediolateral oblique mammograms were obtained. Bilateral screening digital breast tomosynthesis was performed. The images were evaluated with computer-aided detection. COMPARISON:  Previous exam(s). ACR Breast Density Category b: There are scattered areas of fibroglandular density. FINDINGS: In the right breast, a possible mass warrants further evaluation. In the left breast, no findings suspicious for malignancy. IMPRESSION: Further evaluation is suggested for a possible mass in the right breast. RECOMMENDATION: Diagnostic mammogram and possibly ultrasound of the right breast. (Code:[id number]) The patient will be contacted regarding the findings, and additional imaging will be scheduled. BI-RADS CATEGORY  0: Incomplete: Need additional imaging evaluation. Electronically Signed   By: Harmon Pier M.D.   On: 12/16/2022 08:31     Orders Placed This Encounter  Procedures   DG Bone Density    Standing Status:   Future  Standing Expiration Date:   01/11/2024    Order Specific Question:   Reason for Exam  (SYMPTOM  OR DIAGNOSIS REQUIRED)    Answer:   screen    Order Specific Question:   Preferred imaging location?    Answer:   [facility]    All questions were answered. The patient knows to call the clinic with any problems, questions or concerns. The total time spent in the appointment was 45 minutes.     Malachy Mood, MD 01/11/2023 4:22 PM  I, Monica Martinez, am acting as scribe for Malachy Mood, MD.   I have reviewed the above documentation for accuracy and completeness, and I agree with the above.

## 2023-01-11 ENCOUNTER — Encounter: Payer: Self-pay | Admitting: General Practice

## 2023-01-11 ENCOUNTER — Inpatient Hospital Stay (HOSPITAL_BASED_OUTPATIENT_CLINIC_OR_DEPARTMENT_OTHER): Payer: Medicare PPO | Admitting: Hematology

## 2023-01-11 ENCOUNTER — Other Ambulatory Visit: Payer: Self-pay

## 2023-01-11 ENCOUNTER — Encounter: Payer: Self-pay | Admitting: Hematology

## 2023-01-11 ENCOUNTER — Encounter: Payer: Self-pay | Admitting: *Deleted

## 2023-01-11 ENCOUNTER — Ambulatory Visit: Payer: Medicare PPO | Attending: Surgery | Admitting: Physical Therapy

## 2023-01-11 ENCOUNTER — Inpatient Hospital Stay: Payer: Medicare PPO | Attending: Hematology

## 2023-01-11 ENCOUNTER — Ambulatory Visit
Admission: RE | Admit: 2023-01-11 | Discharge: 2023-01-11 | Disposition: A | Discharge status: Home / Self Care | Payer: Medicare PPO | Source: Ambulatory Visit | Attending: Radiation Oncology | Admitting: Radiation Oncology

## 2023-01-11 ENCOUNTER — Encounter: Payer: Self-pay | Admitting: Physical Therapy

## 2023-01-11 VITALS — BP 143/89 | HR 57 | Temp 98.3°F | Resp 18 | Ht 63.5 in | Wt 173.8 lb

## 2023-01-11 DIAGNOSIS — I10 Essential (primary) hypertension: Secondary | ICD-10-CM | POA: Insufficient documentation

## 2023-01-11 DIAGNOSIS — Z170 Estrogen receptor positive status [ER+]: Secondary | ICD-10-CM | POA: Insufficient documentation

## 2023-01-11 DIAGNOSIS — Z79899 Other long term (current) drug therapy: Secondary | ICD-10-CM | POA: Insufficient documentation

## 2023-01-11 DIAGNOSIS — M25569 Pain in unspecified knee: Secondary | ICD-10-CM | POA: Diagnosis not present

## 2023-01-11 DIAGNOSIS — C50411 Malignant neoplasm of upper-outer quadrant of right female breast: Secondary | ICD-10-CM | POA: Insufficient documentation

## 2023-01-11 DIAGNOSIS — E2839 Other primary ovarian failure: Secondary | ICD-10-CM

## 2023-01-11 DIAGNOSIS — F419 Anxiety disorder, unspecified: Secondary | ICD-10-CM | POA: Diagnosis not present

## 2023-01-11 DIAGNOSIS — R293 Abnormal posture: Secondary | ICD-10-CM | POA: Diagnosis not present

## 2023-01-11 LAB — CBC WITH DIFFERENTIAL (CANCER CENTER ONLY)
Abs Immature Granulocytes: 0.01 10*3/uL (ref 0.00–0.07)
Basophils Absolute: 0 10*3/uL (ref 0.0–0.1)
Basophils Relative: 1 %
Eosinophils Absolute: 0 10*3/uL (ref 0.0–0.5)
Eosinophils Relative: 1 %
HCT: 38.7 % (ref 36.0–46.0)
Hemoglobin: 13.3 g/dL (ref 12.0–15.0)
Immature Granulocytes: 0 %
Lymphocytes Relative: 35 %
Lymphs Abs: 1.3 10*3/uL (ref 0.7–4.0)
MCH: 31.7 pg (ref 26.0–34.0)
MCHC: 34.4 g/dL (ref 30.0–36.0)
MCV: 92.1 fL (ref 80.0–100.0)
Monocytes Absolute: 0.3 10*3/uL (ref 0.1–1.0)
Monocytes Relative: 7 %
Neutro Abs: 2.1 10*3/uL (ref 1.7–7.7)
Neutrophils Relative %: 56 %
Platelet Count: 199 10*3/uL (ref 150–400)
RBC: 4.2 MIL/uL (ref 3.87–5.11)
RDW: 13.7 % (ref 11.5–15.5)
WBC Count: 3.7 10*3/uL — ABNORMAL LOW (ref 4.0–10.5)
nRBC: 0 % (ref 0.0–0.2)

## 2023-01-11 LAB — CMP (CANCER CENTER ONLY)
ALT: 9 U/L (ref 0–44)
AST: 17 U/L (ref 15–41)
Albumin: 4.1 g/dL (ref 3.5–5.0)
Alkaline Phosphatase: 46 U/L (ref 38–126)
Anion gap: 6 (ref 5–15)
BUN: 20 mg/dL (ref 8–23)
CO2: 28 mmol/L (ref 22–32)
Calcium: 10.2 mg/dL (ref 8.9–10.3)
Chloride: 101 mmol/L (ref 98–111)
Creatinine: 0.91 mg/dL (ref 0.44–1.00)
GFR, Estimated: >60 mL/min (ref >60)
Glucose, Bld: 104 mg/dL — ABNORMAL HIGH (ref 70–99)
Potassium: 3.8 mmol/L (ref 3.5–5.1)
Sodium: 135 mmol/L (ref 135–145)
Total Bilirubin: 0.9 mg/dL (ref 0.3–1.2)
Total Protein: 7.5 g/dL (ref 6.5–8.1)

## 2023-01-11 LAB — GENETIC SCREENING ORDER

## 2023-01-11 NOTE — Progress Notes (Signed)
[facility] Spiritual Care Note  Met with Lagena and her husband in [facility] Mult[facility]isciplinary Clinic to introduce Support Center team/resources.  she completed SDOH screening; results follow below.  SDOH Interventions    Flowsheet Row Spiritual Care from 01/11/2023 in [facility] at [facility]  SDOH Interventions   Food Insecurity Interventions Intervention Not Indicated  Housing Interventions Intervention Not Indicated  Transportation Interventions Intervention Not Indicated  Utilities Interventions Intervention Not Indicated       SDOH Screenings   Food Insecurity: No Food Insecurity (01/11/2023)  Housing: Low Risk  (01/11/2023)  Transportation Needs: No Transportation Needs (01/11/2023)  Utilities: Not At Risk (01/11/2023)  Depression (PHQ2-9): Low Risk  (01/11/2023)  Tobacco Use: Low Risk  (01/11/2023)    Chaplain and patient discussed common feelings and emotions when being diagnosed with cancer, and the importance of support during treatment.  Chaplain informed patient of the support team and support services at [facility].  Chaplain provided contact information and encouraged patient to call with any questions or concerns.  Ms Storr reports that her diagnosis is especially blindsiding, given her deliberate and healthy lifestyle. She reports good support from her husband and daughter.  Follow up needed: No. Ms Barbe is aware of ongoing Spiritual Care availability and prefers to reach out as needed/desired.   [address] [place], [place], [facility] Pager [phone] Voicemail [phone]

## 2023-01-11 NOTE — Assessment & Plan Note (Signed)
-  cT1cN0M0, stage IA,  ER+/PR+/HER2- --We discussed her imaging findings and the biopsy results in great details. -Giving the early stage disease, she likely need a lumpectomy. She is agreeable with that. She was seen by Dr. Norm Salt today and likely will proceed with surgery soon.  -I recommend a Oncotype Dx test on the surgical sample and we'll make a decision about adjuvant chemotherapy based on the Oncotype result. Written material of this test was given to her. She is 72 yo, but overall in good health, would be a candidate for chemotherapy if her Oncotype recurrence score is high. -If her surgical sentinel lymph node node positive, I recommend mammaprint for further risk stratification and guide adjuvant chemotherapy. -Giving the strong ER and PR expression in her postmenopausal status, I recommend adjuvant endocrine therapy with aromatase inhibitor for a total of 5-10 years to reduce the risk of cancer recurrence. Potential benefits and side effects were discussed with patient and she is interested. -She was also seen by radiation oncologist Dr. Mitzi Hansen today. If her surgical sentinel lymph nodes were negative, she would not need post mastectomy radiation.  -We also discussed the breast cancer surveillance after her surgery. She will continue annual screening mammogram, self exam, and a routine office visit with lab and exam with [place]. -I encouraged her to have healthy diet and exercise regularly.

## 2023-01-11 NOTE — Progress Notes (Addendum)
Radiation Oncology         ([phone]) [phone] ________________________________  Name: Brittany Collier        MRN: [id number]  Date of Service: 01/11/2023 DOB: Oct 31, 1950  CC:Henrine Screws, MD  Harriette Bouillon, MD     REFERRING PHYSICIAN: Harriette Bouillon, MD   DIAGNOSIS: The encounter diagnosis was Malignant neoplasm of upper-outer quadrant of right breast in female, estrogen receptor positive (HCC).   HISTORY OF PRESENT ILLNESS: Brittany Collier is a 72 y.o. female seen in the multidisciplinary breast clinic for a new diagnosis of tight breast cancer. The patient was noted to have a right breast abnormality detected on screening mammogram. Diagnostic mammogram and U/S showed two masses measuring 1.9 cm and 0.9 cm in the right breast both in the 10 o'clock position along with indeterminate right axillary node lymph node with borderline thickening. Patient proceeded with a biopsy on 01/04/23. Pathology results revealed grade II invasive ductal carcinoma with no lymphovascular invasion or calcifications seen. Biopsy on axilla was unable to be obtained. Prognostics showed estrogen and progesterone 100% positive both with strong staining intensity, Her2 negative and proliferation marker Ki67 at 10%.   She is seen today to discuss treatment recommendations of her cancer.      PREVIOUS RADIATION THERAPY: No   PAST MEDICAL HISTORY:  Past Medical History:  Diagnosis Date   Anxiety    Hypertension        PAST SURGICAL HISTORY: Past Surgical History:  Procedure Laterality Date   BREAST BIOPSY Right 01/04/2023   [place] RT BREAST BX W LOC DEV 1ST LESION IMG BX SPEC US GUIDE 01/04/2023 [facility]   HAND SURGERY Right    finger release     FAMILY HISTORY:  Family History  Problem Relation Age of Onset   Arthritis Mother    Diabetes Mother      SOCIAL HISTORY:  reports that she has never smoked. She has never been exposed to tobacco smoke. She has never used smokeless tobacco. She reports  current alcohol use. She reports that she does not use drugs.   ALLERGIES: Penicillins   MEDICATIONS:  Current Outpatient Medications  Medication Sig Dispense Refill   ALPRAZolam (XANAX XR) 1 MG 24 hr tablet Take 1 mg 2 (two) times daily by mouth. MORNING and EVENING     ALPRAZolam (XANAX) 0.5 MG tablet Take 0.5 mg by mouth 2 (two) times daily. MORNING and BEDTIME     amLODipine (NORVASC) 2.5 MG tablet Take 2.5 mg by mouth daily.     hydrochlorothiazide (HYDRODIURIL) 25 MG tablet Take 25 mg by mouth daily.  0   Melatonin 5 MG TABS Take 10 mg at bedtime by mouth.     No current facility-administered medications for this encounter.     REVIEW OF SYSTEMS: On review of systems, the patient reports that she is doing well overall. No breast specific complaints are verbalized.        PHYSICAL EXAM:  Wt Readings from Last 3 Encounters:  01/11/23 173 lb 12.8 oz (78.8 kg)  12/13/22 176 lb (79.8 kg)  11/04/22 150 lb (68 kg)   Temp Readings from Last 3 Encounters:  01/11/23 98.3 F (36.8 C) (Oral)  11/04/22 97.8 F (36.6 C)  06/06/17 98.4 F (36.9 C) (Oral)   BP Readings from Last 3 Encounters:  01/11/23 (!) 143/89  12/13/22 (!) 166/90  11/04/22 (!) 144/87   Pulse Readings from Last 3 Encounters:  01/11/23 (!) 57  12/13/22 (!) 50  11/04/22 Marland Kitchen)  56    In general this is a well appearing female in no acute distress. She's alert and oriented x4 and appropriate throughout the examination. Cardiopulmonary assessment is negative for acute distress and she exhibits normal effort. Bilateral breast exam is deferred.    ECOG = 0  0 - Asymptomatic (Fully active, able to carry on all predisease activities without restriction)  1 - Symptomatic but completely ambulatory (Restricted in physically strenuous activity but ambulatory and able to carry out work of a light or sedentary nature. For example, light housework, office work)  2 - Symptomatic, <50% in bed during the day (Ambulatory  and capable of all self care but unable to carry out any work activities. Up and about more than 50% of waking hours)  3 - Symptomatic, >50% in bed, but not bedbound (Capable of only limited self-care, confined to bed or chair 50% or more of waking hours)  4 - Bedbound (Completely disabled. Cannot carry on any self-care. Totally confined to bed or chair)  5 - Death   Santiago Glad MM, Creech RH, Tormey DC, et al. [phone]). "Toxicity and response criteria of the [facility]". Am. Evlyn Clines. Oncol. 5 (6): 649-55    LABORATORY DATA:  Lab Results  Component Value Date   WBC 3.7 (L) 01/11/2023   HGB 13.3 01/11/2023   HCT 38.7 01/11/2023   MCV 92.1 01/11/2023   PLT 199 01/11/2023   Lab Results  Component Value Date   NA 135 01/11/2023   K 3.8 01/11/2023   CL 101 01/11/2023   CO2 28 01/11/2023   Lab Results  Component Value Date   ALT 9 01/11/2023   AST 17 01/11/2023   ALKPHOS 46 01/11/2023   BILITOT 0.9 01/11/2023      RADIOGRAPHY: [place] RT BREAST BX W LOC DEV 1ST LESION IMG BX SPEC US GUIDE  Addendum Date: 01/05/2023   ADDENDUM REPORT: 01/05/2023 12:42 ADDENDUM: PATHOLOGY revealed: Site Breast, right, needle core biopsy, 10 o'clock, 6 cm fn, venus clip - INVASIVE DUCTAL CARCINOMA, SEE NOTE - DUCTAL CARCINOMA IN SITU, INTERMEDIATE GRADE - TUBULE FORMATION: SCORE 3 NUCLEAR PLEOMORPHISM: SCORE 2 - MITOTIC COUNT: SCORE 1 - TOTAL SCORE: 6 - OVERALL GRADE: 2 - LYMPHOVASCULAR INVASION: NOT IDENTIFIED - CANCER LENGTH: 0.8 CM CALCIFICATIONS: NOT IDENTIFIED Pathology results are CONCORDANT with imaging findings, per Dr. Amie Portland. Pathology results and recommendations below were discussed with patient by telephone on 01/05/2023. Patient reported biopsy site within normal limits with slight tenderness at the site. Post biopsy care instructions were reviewed, questions were answered and my direct phone number was provided to patient. Patient was instructed to call [facility] if any concerns or questions arise related to the biopsy. The patient was referred to the [facility] at [facility] with appointment on 01/11/2023. Pathology results reported by Lynett Grimes, RN on 01/05/2023. Electronically Signed   By: Amie Portland M.D.   On: 01/05/2023 12:42   Result Date: 01/05/2023 CLINICAL DATA:  Patient presents for ultrasound-guided core needle biopsy of a right breast mass. On diagnostic imaging, there actually 2 adjacent masses at 10 o'clock 6 and 7 cm from the nipple. Only the mass at 6 cm from the nipple could be confidently visualized at the time of biopsy. There is also a borderline abnormal right axillary lymph node, cortical thickness of 3.5 mm. This could be visualized, but is in the deep axilla in close proximity to the  axillary artery. For this reason biopsy was not attempted. EXAM: ULTRASOUND GUIDED RIGHT BREAST CORE NEEDLE BIOPSY COMPARISON:  Previous exam(s). PROCEDURE: I met with the patient and we discussed the procedure of ultrasound-guided biopsy, including benefits and alternatives. We discussed the high likelihood of a successful procedure. We discussed the risks of the procedure, including infection, bleeding, tissue injury, clip migration, and inadequate sampling. Informed written consent was given. The usual time-out protocol was performed immediately prior to the procedure. Lesion quadrant: Upper outer quadrant Using sterile technique and 1% Lidocaine as local anesthetic, under direct ultrasound visualization, a 12 gauge spring-loaded device was used to perform biopsy of the irregular hypoechoic mass at 10 o'clock, 6 cm the nipple, using an inferior approach. At the conclusion of the procedure a venous shaped tissue marker clip was deployed into the biopsy cavity. Follow up 2 view mammogram was performed and dictated separately. IMPRESSION: Ultrasound guided biopsy of a right breast mass. No  apparent complications. Electronically Signed: By: Amie Portland M.D. On: 01/04/2023 14:52  MM CLIP PLACEMENT RIGHT  Result Date: 01/04/2023 CLINICAL DATA:  Assess post biopsy marker clip placement following ultrasound-guided core needle biopsy of a right breast mass. EXAM: 3D DIAGNOSTIC RIGHT MAMMOGRAM POST ULTRASOUND BIOPSY COMPARISON:  Previous exam(s). FINDINGS: 3D Mammographic images were obtained following ultrasound guided biopsy of a right breast mass. The biopsy marking clip is in expected position at the site of biopsy. IMPRESSION: Appropriate positioning of the venous shaped biopsy marking clip at the site of biopsy in the within the ill-defined mass in the upper outer right breast. Final Assessment: Post Procedure Mammograms for Marker Placement Electronically Signed   By: Amie Portland M.D.   On: 01/04/2023 15:08  MM 3D DIAGNOSTIC MAMMOGRAM UNILATERAL RIGHT BREAST  Result Date: 01/02/2023 CLINICAL DATA:  72 year old female presenting as a recall from screening for possible right breast mass. EXAM: DIGITAL DIAGNOSTIC UNILATERAL RIGHT MAMMOGRAM WITH TOMOSYNTHESIS; ULTRASOUND RIGHT BREAST LIMITED TECHNIQUE: Right digital diagnostic mammography and breast tomosynthesis was performed.; Targeted ultrasound examination of the right breast was performed COMPARISON:  Previous exam(s). ACR Breast Density Category b: There are scattered areas of fibroglandular density. FINDINGS: Mammogram: Right breast: Spot compression tomosynthesis and 2D spot magnification views of the right breast were performed demonstrating an irregular mass in the upper outer right breast measuring approximately 1.6 cm. There is another possible mass located posterior to this also in the upper outer right breast with a few calcifications. On physical exam I feel a discrete mass in the upper outer right breast. Ultrasound: Targeted ultrasound is performed in the right breast at 10 o'clock 6 cm from the nipple demonstrating an irregular  hypoechoic mass measuring 1.9 x 1.1 x 1.5 cm. At 10 o'clock 7 cm from the nipple there is an irregular hypoechoic mass measuring 0.7 x 0.9 x 0.5 cm. These correspond to the mammographic findings. In the right axilla there is 1 lymph node with borderline cortical thickening measuring 0.4 cm. The remainder of the lymph nodes have very thin cortices. IMPRESSION: 1. Highly suspicious mass in the right breast at 10 o'clock 6 cm from the nipple measuring 1.9 cm. 2. Highly suspicious mass in the right breast at 10 o'clock 7 cm from the nipple measuring 0.9 cm. 3. Indeterminate right axillary lymph node with borderline cortical thickening. RECOMMENDATION: Ultrasound-guided core needle biopsy x3 targeting the two right breast masses at 10 o'clock and the right axillary lymph node. I have discussed the findings and recommendations with the patient. If applicable, a  reminder letter will be sent to the patient regarding the next appointment. BI-RADS CATEGORY  5: Highly suggestive of malignancy. Electronically Signed   By: Emmaline Kluver M.D.   On: 01/02/2023 15:27  [place] LIMITED ULTRASOUND INCLUDING AXILLA RIGHT BREAST  Result Date: 01/02/2023 CLINICAL DATA:  72 year old female presenting as a recall from screening for possible right breast mass. EXAM: DIGITAL DIAGNOSTIC UNILATERAL RIGHT MAMMOGRAM WITH TOMOSYNTHESIS; ULTRASOUND RIGHT BREAST LIMITED TECHNIQUE: Right digital diagnostic mammography and breast tomosynthesis was performed.; Targeted ultrasound examination of the right breast was performed COMPARISON:  Previous exam(s). ACR Breast Density Category b: There are scattered areas of fibroglandular density. FINDINGS: Mammogram: Right breast: Spot compression tomosynthesis and 2D spot magnification views of the right breast were performed demonstrating an irregular mass in the upper outer right breast measuring approximately 1.6 cm. There is another possible mass located posterior to this also in the upper outer right  breast with a few calcifications. On physical exam I feel a discrete mass in the upper outer right breast. Ultrasound: Targeted ultrasound is performed in the right breast at 10 o'clock 6 cm from the nipple demonstrating an irregular hypoechoic mass measuring 1.9 x 1.1 x 1.5 cm. At 10 o'clock 7 cm from the nipple there is an irregular hypoechoic mass measuring 0.7 x 0.9 x 0.5 cm. These correspond to the mammographic findings. In the right axilla there is 1 lymph node with borderline cortical thickening measuring 0.4 cm. The remainder of the lymph nodes have very thin cortices. IMPRESSION: 1. Highly suspicious mass in the right breast at 10 o'clock 6 cm from the nipple measuring 1.9 cm. 2. Highly suspicious mass in the right breast at 10 o'clock 7 cm from the nipple measuring 0.9 cm. 3. Indeterminate right axillary lymph node with borderline cortical thickening. RECOMMENDATION: Ultrasound-guided core needle biopsy x3 targeting the two right breast masses at 10 o'clock and the right axillary lymph node. I have discussed the findings and recommendations with the patient. If applicable, a reminder letter will be sent to the patient regarding the next appointment. BI-RADS CATEGORY  5: Highly suggestive of malignancy. Electronically Signed   By: Emmaline Kluver M.D.   On: 01/02/2023 15:27  XR Hand 2 View Left  Result Date: 12/30/2022 X-ray left hand 2 views Narrowing of radiocarpal joint space worse along the radial border.  Severe first CMC joint degenerative arthritis with joint space loss severe subluxation and compensatory hyperextension of first MCP joint.  Joint space narrowing and subluxation with widening of the second metacarpal head.  PIP joints appear normal there is mild joint space narrowing at multiple DIPs and periarticular calcification at second and third DIPs.  No joint erosions seen. Impression Severe first CMC joint osteoarthritis, mild distal osteoarthritis with second MCP joint changes  suspicious for secondary process but with no erosions or focal demineralization  XR Hand 2 View Right  Result Date: 12/30/2022 X-ray right hand 2 views Radiocarpal joint space appears normal.  There are few cystic changes present in the carpal bones.  Advanced first [facility] joint degenerative arthritis with severe joint space loss and joint subluxation.  Second and third MCP joint space narrowing and subluxation with hook osteophyte process at the third metacarpal head.  Third DIP joint degenerative changes with narrowing and osteophyte more on medial border.  No joint erosions seen. Impression Severe first CMC joint osteoarthritis degenerative changes most advanced in the third digit suspicious for a secondary process with subluxation and hook osteophytes but no erosions or focal demineralization  seen  MM 3D SCREENING MAMMOGRAM BILATERAL BREAST  Result Date: 12/16/2022 CLINICAL DATA:  Screening. EXAM: DIGITAL SCREENING BILATERAL MAMMOGRAM WITH TOMOSYNTHESIS AND CAD TECHNIQUE: Bilateral screening digital craniocaudal and mediolateral oblique mammograms were obtained. Bilateral screening digital breast tomosynthesis was performed. The images were evaluated with computer-aided detection. COMPARISON:  Previous exam(s). ACR Breast Density Category b: There are scattered areas of fibroglandular density. FINDINGS: In the right breast, a possible mass warrants further evaluation. In the left breast, no findings suspicious for malignancy. IMPRESSION: Further evaluation is suggested for a possible mass in the right breast. RECOMMENDATION: Diagnostic mammogram and possibly ultrasound of the right breast. (Code:[id number]) The patient will be contacted regarding the findings, and additional imaging will be scheduled. BI-RADS CATEGORY  0: Incomplete: Need additional imaging evaluation. Electronically Signed   By: Harmon Pier M.D.   On: 12/16/2022 08:31       IMPRESSION/PLAN: 1. Stage 1A (cT1c, cN0, cM0) ER/PR positive,  Her2 negative, Grade 2 Invasive Ductal Carcinoma of the right breast. Dr. Mitzi Hansen discussed the pathology findings and reviewed the nature of early stage breast disease. The consensus from the breast conference includes lumpectomy with SLN followed by possible radiation and antihormone therapy. Considering her favorable, early-stage disease, radiation therapy may offers minimal benefit for locoregional disease control. Therefore, radiotherapy is optional for her if she decides to undergo anti-estrogen therapy and surgical pathology remains favorable.We discussed the risks, benefits, short, and long term effects of radiotherapy, as well as the curative intent. The patient is undecided about whether to proceed if her surgical pathology suggests it as an option. We will follow up with her a few weeks after surgery to review surgical pathology and determine if she wishes to move forward with treatment.    In a visit lasting 60 minutes, greater than 50% of the time was spent face to face reviewing her case, as well as in preparation of, discussing, and coordinating the patient's care.  The above documentation reflects my direct findings during this shared patient visit. Please see the separate note by Dr. Mitzi Hansen on this date for the remainder of the patient's plan of care.    Joyice Faster, [place]    **Disclaimer: This note was dictated with voice recognition software. Similar sounding words can inadvertently be transcribed and this note may contain transcription errors which may not have been corrected upon publication of note.**

## 2023-01-11 NOTE — Therapy (Signed)
OUTPATIENT PHYSICAL THERAPY BREAST CANCER BASELINE EVALUATION   Patient Name: Brittany Collier MRN: [id number] DOB:Nov 22, 1950, 72 y.o., female Today's Date: 01/11/2023  END OF SESSION:  PT End of Session - 01/11/23 1418     Visit Number 1    Number of Visits 2    Date for PT Re-Evaluation 03/08/23    PT Start Time 1329    PT Stop Time 1353   Also saw pt from 1455 to 1507 for a total of 36 minutes   PT Time Calculation (min) 24 min    Activity Tolerance Patient tolerated treatment well    Behavior During Therapy [facility] for tasks assessed/performed             Past Medical History:  Diagnosis Date   Anxiety    Hypertension    Past Surgical History:  Procedure Laterality Date   BREAST BIOPSY Right 01/04/2023   [place] RT BREAST BX W LOC DEV 1ST LESION IMG BX SPEC US GUIDE 01/04/2023 [facility]   HAND SURGERY Right    finger release   Patient Active Problem List   Diagnosis Date Noted   Malignant neoplasm of upper-outer quadrant of right breast in female, estrogen receptor positive (HCC) 01/09/2023   Finger swelling 12/13/2022   Bilateral primary osteoarthritis of knee 12/13/2022   Chest pain 01/27/2017    REFERRING PROVIDER: Dr. Harriette Bouillon  REFERRING DIAG: Right breast cancer  THERAPY DIAG:  Malignant neoplasm of upper-outer quadrant of right breast in female, estrogen receptor positive (HCC)  Abnormal posture  Rationale for Evaluation and Treatment: Rehabilitation  ONSET DATE: 12/14/2022  SUBJECTIVE:                                                                                                                                                                                           SUBJECTIVE STATEMENT: Patient reports she is here today to be seen by her medical team for her newly diagnosed right breast cancer.   PERTINENT HISTORY:  Patient was diagnosed on 12/14/2022 with right grade 2 invasive ductal carcinoma breast cancer. It measures 1.9 cm and is  located in the upper outer quadrant. It is ER/PR positive and HER2 negative with a Ki67 of 10%.   PATIENT GOALS:   reduce lymphedema risk and learn post op HEP.   PAIN:  Are you having pain? No but she reports osteoarthritis in hands and knees which cause pain at times  PRECAUTIONS: Active CA   HAND DOMINANCE: right  WEIGHT BEARING RESTRICTIONS: No  FALLS:  Has patient fallen in last 6 months? No  LIVING ENVIRONMENT: Patient lives with: her husband Lives in:  House/apartment Has following equipment at home: None  OCCUPATION: Retired Chief Technology Officer, middle, high school  LEISURE: She does deep water running 5x/week for 30 min and uses weight machines  PRIOR LEVEL OF FUNCTION: Independent   OBJECTIVE:  COGNITION: Overall cognitive status: Within functional limits for tasks assessed    POSTURE:  Forward head and rounded shoulders posture  UPPER EXTREMITY AROM/PROM:  A/PROM RIGHT   eval   Shoulder extension 73  Shoulder flexion 160  Shoulder abduction 164  Shoulder internal rotation 65  Shoulder external rotation 83    (Blank rows = not tested)  A/PROM LEFT   eval  Shoulder extension 58  Shoulder flexion 150  Shoulder abduction 158  Shoulder internal rotation 83  Shoulder external rotation 80    (Blank rows = not tested)  CERVICAL AROM: All within normal limits  UPPER EXTREMITY STRENGTH: WNL  LYMPHEDEMA ASSESSMENTS:   LANDMARK RIGHT   eval  10 cm proximal to olecranon process 32.5  Olecranon process 25.7  10 cm proximal to ulnar styloid process 23.8  Just proximal to ulnar styloid process 16.6  Across hand at thumb web space 19  At base of 2nd digit 6.9  (Blank rows = not tested)  LANDMARK LEFT   eval  10 cm proximal to olecranon process 32.7  Olecranon process 25  10 cm proximal to ulnar styloid process 22.3  Just proximal to ulnar styloid process 15.9  Across hand at thumb web space 18.2  At base of 2nd digit 6.3  (Blank rows = not  tested)  L-DEX LYMPHEDEMA SCREENING:  The patient was assessed using the L-Dex machine today to produce a lymphedema index baseline score. The patient will be reassessed on a regular basis (typically every 3 months) to obtain new L-Dex scores. If the score is > 6.5 points away from his/her baseline score indicating onset of subclinical lymphedema, it will be recommended to wear a compression garment for 4 weeks, 12 hours per day and then be reassessed. If the score continues to be > 6.5 points from baseline at reassessment, we will initiate lymphedema treatment. Assessing in this manner has a 95% rate of preventing clinically significant lymphedema.   L-DEX FLOWSHEETS - 01/11/23 1400       L-DEX LYMPHEDEMA SCREENING   Measurement Type Unilateral    L-DEX MEASUREMENT EXTREMITY Upper Extremity    POSITION  Standing    DOMINANT SIDE Right    At Risk Side Right    BASELINE SCORE (UNILATERAL) 5.8             QUICK DASH SURVEY:  Brittany Collier - 01/11/23 0001     Open a tight or new jar Mild difficulty    Do heavy household chores (wash walls, wash floors) No difficulty    Carry a shopping bag or briefcase No difficulty    Wash your back No difficulty    Use a knife to cut food No difficulty    Recreational activities in which you take some force or impact through your arm, shoulder, or hand (golf, hammering, tennis) No difficulty    During the past week, to what extent has your arm, shoulder or hand problem interfered with your normal social activities with family, friends, neighbors, or groups? Not at all    During the past week, to what extent has your arm, shoulder or hand problem limited your work or other regular daily activities Not at all    Arm, shoulder, or hand pain. None  Tingling (pins and needles) in your arm, shoulder, or hand None    Difficulty Sleeping No difficulty    DASH Score 2.27 %              PATIENT EDUCATION:  Education details: Lymphedema risk  reduction and post op shoulder/posture HEP Person educated: Patient Education method: Explanation, Demonstration, Handout Education comprehension: Patient verbalized understanding and returned demonstration  HOME EXERCISE PROGRAM: Patient was instructed today in a home exercise program today for post op shoulder range of motion. These included active assist shoulder flexion in sitting, scapular retraction, wall walking with shoulder abduction, and hands behind head external rotation.  She was encouraged to do these twice a day, holding 3 seconds and repeating 5 times when permitted by her physician.   ASSESSMENT:  CLINICAL IMPRESSION: Patient was diagnosed on 12/14/2022 with right grade 2 invasive ductal carcinoma breast cancer. It measures 1.9 cm and is located in the upper outer quadrant. It is ER/PR positive and HER2 negative with a Ki67 of 10%. Her multidisciplinary medical team met prior to her assessments to determine a recommended treatment plan. She is planning to have a right lumpectomy and sentinel node biopsy followed by possible oncotype testing, radiation, and anti-estrogen therapy. She will benefit from a post op PT reassessment to determine needs and from L-Dex screens every 3 months for 2 years to detect subclinical lymphedema.  Pt will benefit from skilled therapeutic intervention to improve on the following deficits: Decreased knowledge of precautions, impaired UE functional use, pain, decreased ROM, postural dysfunction.   PT treatment/interventions: ADL/self-care home management, pt/family education, therapeutic exercise  REHAB POTENTIAL: Excellent  CLINICAL DECISION MAKING: Stable/uncomplicated  EVALUATION COMPLEXITY: Low   GOALS: Goals reviewed with patient? YES  LONG TERM GOALS: (STG=LTG)    Name Target Date Goal status  1 Pt will be able to verbalize understanding of pertinent lymphedema risk reduction practices relevant to her dx specifically related to skin  care.  Baseline:  No knowledge 01/11/2023 Achieved at eval  2 Pt will be able to return demo and/or verbalize understanding of the post op HEP related to regaining shoulder ROM. Baseline:  No knowledge 01/11/2023 Achieved at eval  3 Pt will be able to verbalize understanding of the importance of attending the post op After Breast CA Class for further lymphedema risk reduction education and therapeutic exercise.  Baseline:  No knowledge 01/11/2023 Achieved at eval  4 Pt will demo she has regained full shoulder ROM and function post operatively compared to baselines.  Baseline: See objective measurements taken today. 03/08/2023     PLAN:  PT FREQUENCY/DURATION: EVAL and 1 follow up appointment.   PLAN FOR NEXT SESSION: will reassess 3-4 weeks post op to determine needs.   Patient will follow up at outpatient cancer rehab 3-4 weeks following surgery.  If the patient requires physical therapy at that time, a specific plan will be dictated and sent to the referring physician for approval. The patient was educated today on appropriate basic range of motion exercises to begin post operatively and the importance of attending the After Breast Cancer class following surgery.  Patient was educated today on lymphedema risk reduction practices as it pertains to recommendations that will benefit the patient immediately following surgery.  She verbalized good understanding.    Physical Therapy Information for After Breast Cancer Surgery/Treatment:  Lymphedema is a swelling condition that you may be at risk for in your arm if you have lymph nodes removed from the armpit area.  After a sentinel node biopsy, the risk is approximately 5-9% and is higher after an axillary node dissection.  There is treatment available for this condition and it is not life-threatening.  Contact your physician or physical therapist with concerns. You may begin the 4 shoulder/posture exercises (see additional sheet) when permitted by your  physician (typically a week after surgery).  If you have drains, you may need to wait until those are removed before beginning range of motion exercises.  A general recommendation is to not lift your arms above shoulder height until drains are removed.  These exercises should be done to your tolerance and gently.  This is not a "no pain/no gain" type of recovery so listen to your body and stretch into the range of motion that you can tolerate, stopping if you have pain.  If you are having immediate reconstruction, ask your plastic surgeon about doing exercises as he or she may want you to wait. We encourage you to attend the free one time ABC (After Breast Cancer) class offered by [facility].  You will learn information related to lymphedema risk, prevention and treatment and additional exercises to regain mobility following surgery.  You can call [phone] for more information.  This is offered the 1st and 3rd Monday of each month.  You only attend the class one time. While undergoing any medical procedure or treatment, try to avoid blood pressure being taken or needle sticks from occurring on the arm on the side of cancer.   This recommendation begins after surgery and continues for the rest of your life.  This may help reduce your risk of getting lymphedema (swelling in your arm). An excellent resource for those seeking information on lymphedema is the National Lymphedema Network's web site. It can be accessed at www.lymphnet.org If you notice swelling in your hand, arm or breast at any time following surgery (even if it is many years from now), please contact your doctor or physical therapist to discuss this.  Lymphedema can be treated at any time but it is easier for you if it is treated early on.  If you feel like your shoulder motion is not returning to normal in a reasonable amount of time, please contact your surgeon or physical therapist.  [facility] [phone]. [address], [place] [address]  ABC CLASS After Breast Cancer Class  After Breast Cancer Class is a specially designed exercise class to assist you in a safe recover after having breast cancer surgery.  In this class you will learn how to get back to full function whether your drains were just removed or if you had surgery a month ago.  This one-time class is held the 1st and 3rd Monday of every month from 11:00 a.m. until 12:00 noon virtually.  This class is FREE and space is limited. For more information or to register for the next available class, call [phone].  Class Goals  Understand specific stretches to improve the flexibility of you chest and shoulder. Learn ways to safely strengthen your upper body and improve your posture. Understand the warning signs of infection and why you may be at risk for an arm infection. Learn about Lymphedema and prevention.  ** You do not attend this class until after surgery.  Drains must be removed to participate  Patient was instructed today in a home exercise program today for post op shoulder range of motion. These included active assist shoulder  flexion in sitting, scapular retraction, wall walking with shoulder abduction, and hands behind head external rotation.  She was encouraged to do these twice a day, holding 3 seconds and repeating 5 times when permitted by her physician.  Bethann Punches, [place] 01/11/23 4:14 PM

## 2023-01-12 ENCOUNTER — Telehealth: Payer: Self-pay | Admitting: Genetic Counselor

## 2023-01-12 ENCOUNTER — Ambulatory Visit: Payer: Self-pay | Admitting: Surgery

## 2023-01-12 DIAGNOSIS — C50911 Malignant neoplasm of unspecified site of right female breast: Secondary | ICD-10-CM

## 2023-01-12 NOTE — Research (Signed)
[employer] 2021-05 - Specimen Collection Study to Evaluate Biomarkers in Subjects with Cancer    Patient Brittany Collier was identified by Dr. Mosetta Putt  as a potential candidate for the above listed study.  This Clinical Research Coordinator met with FOYE HAGGART, [id number] on 01/12/23 in a manner and location that ensures patient privacy to discuss participation in the above listed research study.  Patient is Accompanied by their husband .  Patient was previously provided with informed consent documents.  Patient confirmed they have read the informed consent documents.  As outlined in the informed consent form, this Coordinator and NATHALYA WOLANSKI discussed the purpose of the research study, the investigational nature of the study, study procedures and requirements for study participation, potential risks and benefits of study participation, as well as alternatives to participation.  This study is not blinded or double-blinded. The patient understands participation is voluntary and they may withdraw from study participation at any time.  This study does not involve randomization.  This study does not involve an investigational drug or device. This study does not involve a placebo. Patient understands enrollment is pending full eligibility review.   Confidentiality and how the patient's information will be used as part of study participation were discussed.  Patient was informed there is not reimbursement provided for their time and effort spent on trial participation.  The patient is encouraged to discuss research study participation with their insurance provider to determine what costs they may incur as part of study participation, including research related injury.    All questions were answered to patient's satisfaction.  The informed consent with embedded HIPAA language was reviewed page by page.  The patient's mental and emotional status is appropriate to provide informed consent, and the patient  verbalizes an understanding of study participation.  Patient has agreed to participate in the above listed research study and has voluntarily signed the informed consent version [employer] approved version 14 Aug 2020  with embedded HIPAA language, version 3  on 01/12/23 at 3:00PM.  The patient was provided with a copy of the signed informed consent form with embedded HIPAA language for their reference.  No study specific procedures were obtained prior to the signing of the informed consent document.  Approximately 30 minutes were spent with the patient reviewing the informed consent documents.  Patient was not requested to complete a Release of Information form.   The patient has not completed all study procedures, and I will call on Tuesday, [id number] to schedule an appointment for study questions and blood collection.  Felecia Jan, [facility] 01/12/2023 3:41 PM

## 2023-01-12 NOTE — Telephone Encounter (Signed)
Brittany Collier was seen by a genetic counselor during the breast multidisciplinary clinic on 01/11/2023. In addition to her personal history of breast cancer, she reported no family history of cancer. She does not meet NCCN criteria for genetic testing at this time. She was still offered genetic counseling and testing but declined. We encourage her to contact us if there are any changes to her personal or family history of cancer. If she meets NCCN criteria based on the updated personal/family history, she would be recommended to have genetic counseling and testing.   Lalla Brothers, MS, [facility] Counselor [email or web address] (P) [phone]

## 2023-01-13 ENCOUNTER — Other Ambulatory Visit: Payer: Self-pay | Admitting: Surgery

## 2023-01-13 DIAGNOSIS — C50911 Malignant neoplasm of unspecified site of right female breast: Secondary | ICD-10-CM

## 2023-01-16 ENCOUNTER — Encounter: Payer: Self-pay | Admitting: *Deleted

## 2023-01-16 ENCOUNTER — Telehealth: Payer: Self-pay | Admitting: *Deleted

## 2023-01-16 NOTE — Telephone Encounter (Signed)
Spoke to pt concerning [facility] from 6.12.24. Denies questions or concerns regarding dx or treatment care plan. Encourage pt to call with needs. Received verbal understanding.

## 2023-01-17 ENCOUNTER — Telehealth: Payer: Self-pay

## 2023-01-17 DIAGNOSIS — Z170 Estrogen receptor positive status [ER+]: Secondary | ICD-10-CM

## 2023-01-17 NOTE — Telephone Encounter (Signed)
[employer] 2021-05 - Specimen Collection Study to Evaluate Biomarkers in Subjects with Cancer   Called the patient to set up an appointment time to complete the exact science study procedures. The patient stated that tomorrow at 1:30pm would be the best time and we will meet then.  Felecia Jan, [facility] 01/17/2023 4:05 PM

## 2023-01-18 ENCOUNTER — Telehealth: Payer: Self-pay

## 2023-01-18 ENCOUNTER — Inpatient Hospital Stay: Payer: Medicare PPO

## 2023-01-18 DIAGNOSIS — Z170 Estrogen receptor positive status [ER+]: Secondary | ICD-10-CM

## 2023-01-18 NOTE — Telephone Encounter (Signed)
[employer] 2021-05 - Specimen Collection Study to Evaluate Biomarkers in Subjects with Cancer   Called the patient to reschedule their research visit, but the patient did not answer. Left a voice message with my contact information to reschedule visit.   Felecia Jan, [facility] 01/18/2023 4:25 PM

## 2023-01-25 ENCOUNTER — Encounter (HOSPITAL_BASED_OUTPATIENT_CLINIC_OR_DEPARTMENT_OTHER): Payer: Self-pay | Admitting: Surgery

## 2023-01-25 ENCOUNTER — Other Ambulatory Visit: Payer: Self-pay

## 2023-01-27 ENCOUNTER — Telehealth: Payer: Self-pay

## 2023-01-27 DIAGNOSIS — Z170 Estrogen receptor positive status [ER+]: Secondary | ICD-10-CM

## 2023-01-27 NOTE — Telephone Encounter (Signed)
[employer] 2021-05 - Specimen Collection Study to Evaluate Biomarkers in Subjects with Cancer   Called the patient to discuss starting study procedures for the exact science study. But the patient changed their mind and withdrew consent to partcipate in the study.   Felecia Jan, [facility] 01/27/2023 11:44 AM

## 2023-01-27 NOTE — Progress Notes (Signed)

## 2023-02-02 NOTE — Anesthesia Preprocedure Evaluation (Addendum)
Anesthesia Evaluation  Patient identified by MRN, date of birth, ID band Patient awake    Reviewed: Allergy & Precautions, NPO status , Patient's Chart, lab work & pertinent test results  History of Anesthesia Complications Negative for: history of anesthetic complications  Airway Mallampati: II  TM Distance: >3 FB Neck ROM: Full    Dental  (+) Missing,    Pulmonary    Pulmonary exam normal        Cardiovascular hypertension, Pt. on medications Normal cardiovascular exam     Neuro/Psych   Anxiety        GI/Hepatic   Endo/Other    Renal/GU      Musculoskeletal  (+) Arthritis ,    Abdominal   Peds  Hematology   Anesthesia Other Findings Right breast ca  Reproductive/Obstetrics                              Anesthesia Physical Anesthesia Plan  ASA: 2  Anesthesia Plan: General   Post-op Pain Management: Tylenol PO (pre-op)* and Regional block*   Induction: Intravenous  PONV Risk Score and Plan: 3 and Treatment may vary due to age or medical condition, Ondansetron and Dexamethasone  Airway Management Planned: LMA  Additional Equipment: None  Intra-op Plan:   Post-operative Plan: Extubation in OR  Informed Consent: I have reviewed the patients History and Physical, chart, labs and discussed the procedure including the risks, benefits and alternatives for the proposed anesthesia with the patient or authorized representative who has indicated his/her understanding and acceptance.     Dental advisory given  Plan Discussed with: CRNA  Anesthesia Plan Comments:         Anesthesia Quick Evaluation

## 2023-02-03 ENCOUNTER — Other Ambulatory Visit: Payer: Self-pay | Admitting: Surgery

## 2023-02-03 ENCOUNTER — Ambulatory Visit
Admission: RE | Admit: 2023-02-03 | Discharge: 2023-02-03 | Disposition: A | Discharge status: Home / Self Care | Payer: Medicare PPO | Source: Ambulatory Visit | Attending: Surgery | Admitting: Surgery

## 2023-02-03 DIAGNOSIS — C50911 Malignant neoplasm of unspecified site of right female breast: Secondary | ICD-10-CM

## 2023-02-03 DIAGNOSIS — D0511 Intraductal carcinoma in situ of right breast: Secondary | ICD-10-CM | POA: Diagnosis not present

## 2023-02-03 HISTORY — PX: BREAST BIOPSY: SHX20

## 2023-02-05 NOTE — H&P (Signed)
Chief Complaint: Breast Cancer  History of Present Illness: Female Brittany Collier is a 72 y.o. female who is seen today as an office consultation for evaluation of Breast Cancer  72 year old female presents to the [facility] today for evaluation of stage I right breast cancer diagnosed by mammography. Patient denies any history of breast pain or mass. Spectacle mammogram and she gets these regularly. There is a 1.9 cm mass upper outer quadrants and adjacent 9 mm mass in the same position noted. 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  Review of Systems: A complete review of systems was obtained from the patient. I have reviewed this information and discussed as appropriate with the patient. See HPI as well for other ROS.    Medical History: Past Medical History: Diagnosis Date Anxiety History of cancer Hypertension  Patient Active Problem List Diagnosis Malignant neoplasm of upper-outer quadrant of right breast in female, estrogen receptor positive (CMS/HHS-HCC) Bilateral primary osteoarthritis of knee  Past Surgical History: Procedure Laterality Date .Right Breast Biopsy Right 01/04/2023 Right Hand Surgery Right   Allergies Allergen Reactions Penicillins Other (See Comments) From childhood: Has patient had a PCN reaction causing immediate rash, facial/tongue/throat swelling, SOB or lightheadedness with hypotension: Unknown Has patient had a PCN reaction causing severe rash involving mucus membranes or skin necrosis: Unknown Has patient had a PCN reaction that required hospitalization: Unknown Has patient had a PCN reaction occurring within the last 10 years: No If all of the above answers are "NO", then may proceed with Cephalosporin use.  Current Outpatient Medications on File Prior to Visit Medication Sig Dispense Refill hydroCHLOROthiazide (HYDRODIURIL) 25 MG tablet Take 25 mg by mouth once  daily ALPRAZolam (XANAX XR) 1 MG 24 hr tablet Take 1 mg by mouth 2 (two) times daily amLODIPine (NORVASC) 2.5 MG tablet Take 2.5 mg by mouth once daily potassium chloride (MICRO-K) 10 MEQ ER capsule Take 10 mEq by mouth once daily  No current facility-administered medications on file prior to visit.  Family History Problem Relation Age of Onset Diabetes Mother   Social History  Tobacco Use Smoking Status Never Smokeless Tobacco Never   Social History  Socioeconomic History Marital status: Married Tobacco Use Smoking status: Never Smokeless tobacco: Never Vaping Use Vaping status: Never Used Substance and Sexual Activity Alcohol use: Not Currently Drug use: Never  Objective: There were no vitals filed for this visit. There is no height or weight on file to calculate BMI.  Physical Exam Exam conducted with a chaperone present. HENT: Head: Normocephalic. Eyes: Pupils: Pupils are equal, round, and reactive to light. Cardiovascular: Rate and Rhythm: Normal rate. Pulmonary: Effort: Pulmonary effort is normal. Breath sounds: No stridor. Chest: Breasts: Right: Normal. Left: Normal.  Comments: Bruising right breast noted. Lymphadenopathy: Upper Body: Right upper body: No supraclavicular or axillary adenopathy. Left upper body: No supraclavicular or axillary adenopathy. Skin: General: Skin is warm. Neurological: General: No focal deficit present. Mental Status: She is alert. Psychiatric: Mood and Affect: Mood normal. Behavior: Behavior normal.    Labs, Imaging and Diagnostic Testing:  Diagnosis Breast, right, needle core biopsy, 10 o'clock, 6cmfn, venus clip - INVASIVE DUCTAL CARCINOMA, SEE NOTE - DUCTAL CARCINOMA IN SITU, INTERMEDIATE GRADE - TUBULE FORMATION: SCORE 3 - NUCLEAR PLEOMORPHISM: SCORE 2 - MITOTIC COUNT: SCORE 1 - TOTAL SCORE: 6 - OVERALL GRADE: 2 - LYMPHOVASCULAR INVASION: NOT IDENTIFIED - CANCER LENGTH: 0.8 CM - CALCIFICATIONS: NOT  IDENTIFIED NOTE: 1 of 4 FINAL  for Brittany Collier [phone]) Diagnosis(continued) DR. Kenyon Ana REVIEWED THE CASE AND CONCURS WITH THE INTERPRETATION. A BREAST PROGNOSTIC PROFILE (ER, PR, KI-67 AND HER2) IS PENDING AND WILL BE REPORTED IN AN ADDENDUM. [facility] WAS NOTIFIED ON 01/05/2023. Clifton James M.D. Pathologist, Electronic Signature (Case signed 01/05/2023) Specimen Gross and Clinical Information Specimen Comment TIF: 2:43 PM, CIT < 1 minute; 1.9cm irregular mass Specimen(s) Obtained: Breast, right, needle core biopsy, 10 o'clock, 6cmfn, venus clip Specimen Clinical Information Carcinoma suspected Gross Received in formalin labeled with the patient's name (Brittany Collier) and "RB 10 oclk 6 cmfn" are three pieces of yellow-tan fibrofatty tissue ranging from 1.1 x 0.2 x 0.2 cm to 1.4 x 0.2 x 0.2 cm, submitted in toto in a single cassette. TIF 14:43, CIT <1 minute (venus clip). (LEF, 01/04/2023) Stain(s) used in Diagnosis: The following stain(s) were used in diagnosing the case: Her2 FISH, PR-ACIS, Her2 by IHC, KI-67-ACIS, ER-ACIS. The control(s) stained appropriately. ADDITIONAL INFORMATION: 1A. Breasr, right , needle core biopsy, 10 o'clock, 6 cmfn, venus clip FLUORESCENCE IN-SITU HYBRIDIZATION Results: GROUP 5: HER2 **NEGATIVE** Equivocal form of amplification of the HER2 gene was detected in the IHC 2+ tissue sample received from this individual. HER2 FISH was performed by a technologist and cell imaging and analysis on the [employer]. RATIO OF HER2/CEN17 SIGNALS 1.51 AVERAGE HER2 COPY NUMBER PER CELL 3.10 2 of 4 FINAL for Brittany Collier ([id number]) ADDITIONAL INFORMATION:(continued) The ratio of HER2/CEN 17 is within the range < 2.0 of HER2/CEN 17 and a copy number of HER2 signals per cell is <4.0. Arch Pathol Lab Med 1:1,2018 Marlena Clipper MD Pathologist, Electronic Signature ( Signed 01/09/2023) 1A) Breast, right, needle core biopsy, 10 o'clock, 6  cmfn, venus clip PROGNOSTIC INDICATORS Results: IMMUNOHISTOCHEMICAL AND MORPHOMETRIC ANALYSIS PERFORMED MANUALLY The tumor cells are EQUIVOCAL for Her2 (2+). Her2 by FISH will be performed and the results reported separately. Estrogen Receptor: 100%, POSITIVE, STRONG STAINING INTENSITY Progesterone Receptor: 100%, POSITIVE, STRONG STAINING INTENSITY Proliferation Marker Ki67: 10% REFERENCE RANGE ESTROGEN RECEPTOR NEGATIVE 0% POSITIVE =>1% REFERENCE RANGE PROGESTERONE RECEPTOR NEGATIVE 0% POSITIVE =>1% All controls stained appropriately Jerene Bears MD Pathologist, Electronic Signature ( Signed 01/06/2023)  CLINICAL DATA: 72 year old female presenting as a recall from screening for possible right breast mass.  EXAM: DIGITAL DIAGNOSTIC UNILATERAL RIGHT MAMMOGRAM WITH TOMOSYNTHESIS; ULTRASOUND RIGHT BREAST LIMITED  TECHNIQUE: Right digital diagnostic mammography and breast tomosynthesis was performed.; Targeted ultrasound examination of the right breast was performed  COMPARISON: Previous exam(s).  ACR Breast Density Category b: There are scattered areas of fibroglandular density.  FINDINGS: Mammogram:  Right breast: Spot compression tomosynthesis and 2D spot magnification views of the right breast were performed demonstrating an irregular mass in the upper outer right breast measuring approximately 1.6 cm. There is another possible mass located posterior to this also in the upper outer right breast with a few calcifications.  On physical exam I feel a discrete mass in the upper outer right breast.  Ultrasound:  Targeted ultrasound is performed in the right breast at 10 o'clock 6 cm from the nipple demonstrating an irregular hypoechoic mass measuring 1.9 x 1.1 x 1.5 cm. At 10 o'clock 7 cm from the nipple there is an irregular hypoechoic mass measuring 0.7 x 0.9 x 0.5 cm. These correspond to the mammographic findings.  In the right axilla there is 1 lymph node  with borderline cortical thickening measuring 0.4 cm. The remainder of the lymph nodes have very thin cortices.  IMPRESSION: 1. Highly suspicious  mass in the right breast at 10 o'clock 6 cm from the nipple measuring 1.9 cm. 2. Highly suspicious mass in the right breast at 10 o'clock 7 cm from the nipple measuring 0.9 cm. 3. Indeterminate right axillary lymph node with borderline cortical thickening.  RECOMMENDATION: Ultrasound-guided core needle biopsy x3 targeting the two right breast masses at 10 o'clock and the right axillary lymph node.  I have discussed the findings and recommendations with the patient. If applicable, a reminder letter will be sent to the patient regarding the next appointment.  BI-RADS CATEGORY 5: Highly suggestive of malignancy.   Electronically Signed By: Emmaline Kluver M.D. On: 01/02/2023 15:27  Assessment and Plan:  Diagnoses and all orders for this visit:  Malignant neoplasm of upper-outer quadrant of right breast in female, estrogen receptor positive (CMS/HHS-HCC)   Reviewed imaging, pathology and pathophysiology of breast cancer. Reviewed breast conserving surgery with mastectomy with reconstruction. Reviewed long-term survival with age as well as local regional recurrence and quality of life.  She is undecided on what she wants to do but she is a good candidate for right breast seed lumpectomy and right axillary sentinel lymph node mapping. The abnormal node can be examined during surgery since it was borderline to begin with. Her examination is otherwise normal. Discussed risk of bleeding, infection, lymphedema, shoulder stiffness, numbness, pain, cosmetic deformity, and the need for the treatment standard procedures. Discussed potential injury to neighboring structures. She will think things over and decide at a later date   Hayden Rasmussen, MD  I spent a total of 55 minutes in both face-to-face and non-face-to-face activities,  excluding procedures performed, for this visit on the date of this encounter.   Electronically signed by Harriette Bouillon

## 2023-02-06 ENCOUNTER — Encounter (HOSPITAL_BASED_OUTPATIENT_CLINIC_OR_DEPARTMENT_OTHER): Payer: Self-pay | Admitting: Surgery

## 2023-02-06 ENCOUNTER — Ambulatory Visit (HOSPITAL_BASED_OUTPATIENT_CLINIC_OR_DEPARTMENT_OTHER): Payer: Medicare PPO | Admitting: Anesthesiology

## 2023-02-06 ENCOUNTER — Encounter (HOSPITAL_BASED_OUTPATIENT_CLINIC_OR_DEPARTMENT_OTHER)
Admission: RE | Disposition: A | Discharge status: Home / Self Care | Payer: Self-pay | Source: Home / Self Care | Attending: Surgery

## 2023-02-06 ENCOUNTER — Other Ambulatory Visit: Payer: Self-pay

## 2023-02-06 ENCOUNTER — Ambulatory Visit (HOSPITAL_BASED_OUTPATIENT_CLINIC_OR_DEPARTMENT_OTHER)
Admission: RE | Admit: 2023-02-06 | Discharge: 2023-02-06 | Disposition: A | Discharge status: Home / Self Care | Payer: Medicare PPO | Attending: Surgery | Admitting: Surgery

## 2023-02-06 ENCOUNTER — Ambulatory Visit
Admission: RE | Admit: 2023-02-06 | Discharge: 2023-02-06 | Disposition: A | Discharge status: Home / Self Care | Payer: Medicare PPO | Source: Ambulatory Visit | Attending: Surgery | Admitting: Surgery

## 2023-02-06 DIAGNOSIS — C50411 Malignant neoplasm of upper-outer quadrant of right female breast: Secondary | ICD-10-CM | POA: Insufficient documentation

## 2023-02-06 DIAGNOSIS — Z170 Estrogen receptor positive status [ER+]: Secondary | ICD-10-CM | POA: Diagnosis not present

## 2023-02-06 DIAGNOSIS — Z79899 Other long term (current) drug therapy: Secondary | ICD-10-CM | POA: Insufficient documentation

## 2023-02-06 DIAGNOSIS — I10 Essential (primary) hypertension: Secondary | ICD-10-CM | POA: Insufficient documentation

## 2023-02-06 DIAGNOSIS — D0511 Intraductal carcinoma in situ of right breast: Secondary | ICD-10-CM | POA: Diagnosis not present

## 2023-02-06 DIAGNOSIS — F419 Anxiety disorder, unspecified: Secondary | ICD-10-CM | POA: Diagnosis not present

## 2023-02-06 DIAGNOSIS — C50911 Malignant neoplasm of unspecified site of right female breast: Secondary | ICD-10-CM | POA: Diagnosis not present

## 2023-02-06 DIAGNOSIS — Z01818 Encounter for other preprocedural examination: Secondary | ICD-10-CM

## 2023-02-06 HISTORY — PX: BREAST LUMPECTOMY WITH RADIOACTIVE SEED AND SENTINEL LYMPH NODE BIOPSY: SHX6550

## 2023-02-06 SURGERY — BREAST LUMPECTOMY WITH RADIOACTIVE SEED AND SENTINEL LYMPH NODE BIOPSY
Anesthesia: General | Site: Breast | Laterality: Right

## 2023-02-06 MED ORDER — MAGTRACE LYMPHATIC TRACER
INTRAMUSCULAR | Status: DC | PRN
  Administered 2023-02-06: 2 mL via INTRAMUSCULAR

## 2023-02-06 MED ORDER — FENTANYL CITRATE (PF) 100 MCG/2ML IJ SOLN
INTRAMUSCULAR | Status: DC | PRN
  Administered 2023-02-06: 50 ug via INTRAVENOUS

## 2023-02-06 MED ORDER — DEXAMETHASONE SODIUM PHOSPHATE 10 MG/ML IJ SOLN
INTRAMUSCULAR | Status: DC | PRN
  Administered 2023-02-06: 8 mg via INTRAVENOUS

## 2023-02-06 MED ORDER — EPHEDRINE SULFATE (PRESSORS) 50 MG/ML IJ SOLN
INTRAMUSCULAR | Status: DC | PRN
  Administered 2023-02-06: 5 mg via INTRAVENOUS

## 2023-02-06 MED ORDER — OXYCODONE HCL 5 MG/5ML PO SOLN: 5.0000 mg | Freq: Once | ORAL | Status: DC | PRN

## 2023-02-06 MED ORDER — BUPIVACAINE-EPINEPHRINE (PF) 0.5% -1:200000 IJ SOLN
INTRAMUSCULAR | Status: DC | PRN
  Administered 2023-02-06: 20 mL via PERINEURAL

## 2023-02-06 MED ORDER — CHLORHEXIDINE GLUCONATE CLOTH 2 % EX PADS: 6.0000 | MEDICATED_PAD | Freq: Once | CUTANEOUS | Status: DC

## 2023-02-06 MED ORDER — DEXMEDETOMIDINE HCL IN NACL 80 MCG/20ML IV SOLN
INTRAVENOUS | Status: DC | PRN
  Administered 2023-02-06: 8 ug via INTRAVENOUS

## 2023-02-06 MED ORDER — BUPIVACAINE LIPOSOME 1.3 % IJ SUSP
INTRAMUSCULAR | Status: DC | PRN
  Administered 2023-02-06: 10 mL via PERINEURAL

## 2023-02-06 MED ORDER — MIDAZOLAM HCL 2 MG/2ML IJ SOLN
INTRAMUSCULAR | Status: AC
  Filled 2023-02-06: qty 2

## 2023-02-06 MED ORDER — DEXMEDETOMIDINE HCL IN NACL 80 MCG/20ML IV SOLN
INTRAVENOUS | Status: AC
  Filled 2023-02-06: qty 20

## 2023-02-06 MED ORDER — ONDANSETRON HCL 4 MG/2ML IJ SOLN
INTRAMUSCULAR | Status: DC | PRN
  Administered 2023-02-06: 4 mg via INTRAVENOUS

## 2023-02-06 MED ORDER — BUPIVACAINE-EPINEPHRINE (PF) 0.25% -1:200000 IJ SOLN
INTRAMUSCULAR | Status: AC
  Filled 2023-02-06: qty 30

## 2023-02-06 MED ORDER — BUPIVACAINE-EPINEPHRINE (PF) 0.25% -1:200000 IJ SOLN
INTRAMUSCULAR | Status: DC | PRN
  Administered 2023-02-06: 22 mL via PERINEURAL

## 2023-02-06 MED ORDER — ONDANSETRON HCL 4 MG/2ML IJ SOLN
INTRAMUSCULAR | Status: AC
  Filled 2023-02-06: qty 2

## 2023-02-06 MED ORDER — EPHEDRINE 5 MG/ML INJ
INTRAVENOUS | Status: AC
  Filled 2023-02-06: qty 5

## 2023-02-06 MED ORDER — DEXAMETHASONE SODIUM PHOSPHATE 10 MG/ML IJ SOLN
INTRAMUSCULAR | Status: AC
  Filled 2023-02-06: qty 1

## 2023-02-06 MED ORDER — GABAPENTIN 300 MG PO CAPS
ORAL_CAPSULE | ORAL | Status: AC
  Filled 2023-02-06: qty 1

## 2023-02-06 MED ORDER — FENTANYL CITRATE (PF) 100 MCG/2ML IJ SOLN: 25.0000 ug | INTRAMUSCULAR | Status: DC | PRN

## 2023-02-06 MED ORDER — ACETAMINOPHEN 500 MG PO TABS
1000.0000 mg | ORAL_TABLET | ORAL | Status: AC
  Administered 2023-02-06: 1000 mg via ORAL

## 2023-02-06 MED ORDER — LIDOCAINE HCL (CARDIAC) PF 100 MG/5ML IV SOSY
PREFILLED_SYRINGE | INTRAVENOUS | Status: DC | PRN
  Administered 2023-02-06: 50 mg via INTRAVENOUS

## 2023-02-06 MED ORDER — CLINDAMYCIN PHOSPHATE 900 MG/50ML IV SOLN
900.0000 mg | INTRAVENOUS | Status: AC
  Administered 2023-02-06: 900 mg via INTRAVENOUS

## 2023-02-06 MED ORDER — PROPOFOL 10 MG/ML IV BOLUS
INTRAVENOUS | Status: DC | PRN
  Administered 2023-02-06: 150 mg via INTRAVENOUS

## 2023-02-06 MED ORDER — LACTATED RINGERS IV SOLN: INTRAVENOUS | Status: DC

## 2023-02-06 MED ORDER — MIDAZOLAM HCL 2 MG/2ML IJ SOLN
1.0000 mg | Freq: Once | INTRAMUSCULAR | Status: AC
  Administered 2023-02-06: 1 mg via INTRAVENOUS

## 2023-02-06 MED ORDER — CLINDAMYCIN PHOSPHATE 900 MG/50ML IV SOLN
INTRAVENOUS | Status: AC
  Filled 2023-02-06: qty 50

## 2023-02-06 MED ORDER — GLYCOPYRROLATE 0.2 MG/ML IJ SOLN
INTRAMUSCULAR | Status: DC | PRN
  Administered 2023-02-06: .2 mg via INTRAVENOUS

## 2023-02-06 MED ORDER — GLYCOPYRROLATE PF 0.2 MG/ML IJ SOSY
PREFILLED_SYRINGE | INTRAMUSCULAR | Status: AC
  Filled 2023-02-06: qty 1

## 2023-02-06 MED ORDER — FENTANYL CITRATE (PF) 100 MCG/2ML IJ SOLN
INTRAMUSCULAR | Status: AC
  Filled 2023-02-06: qty 2

## 2023-02-06 MED ORDER — OXYCODONE HCL 5 MG PO TABS
5.0000 mg | ORAL_TABLET | Freq: Four times a day (QID) | ORAL | 0 refills | Status: DC | PRN
Start: 2023-02-06 — End: 2023-03-15

## 2023-02-06 MED ORDER — GABAPENTIN 300 MG PO CAPS
300.0000 mg | ORAL_CAPSULE | ORAL | Status: AC
  Administered 2023-02-06: 300 mg via ORAL

## 2023-02-06 MED ORDER — PROPOFOL 10 MG/ML IV BOLUS
INTRAVENOUS | Status: AC
  Filled 2023-02-06: qty 20

## 2023-02-06 MED ORDER — LIDOCAINE 2% (20 MG/ML) 5 ML SYRINGE
INTRAMUSCULAR | Status: AC
  Filled 2023-02-06: qty 5

## 2023-02-06 MED ORDER — ACETAMINOPHEN 500 MG PO TABS
ORAL_TABLET | ORAL | Status: AC
  Filled 2023-02-06: qty 2

## 2023-02-06 MED ORDER — ACETAMINOPHEN 500 MG PO TABS: 1000.0000 mg | ORAL_TABLET | Freq: Once | ORAL | Status: AC

## 2023-02-06 MED ORDER — FENTANYL CITRATE (PF) 100 MCG/2ML IJ SOLN
50.0000 ug | Freq: Once | INTRAMUSCULAR | Status: AC
  Administered 2023-02-06: 50 ug via INTRAVENOUS

## 2023-02-06 MED ORDER — OXYCODONE HCL 5 MG PO TABS: 5.0000 mg | ORAL_TABLET | Freq: Once | ORAL | Status: DC | PRN

## 2023-02-06 SURGICAL SUPPLY — 49 items
ADH SKN CLS APL DERMABOND .7 (GAUZE/BANDAGES/DRESSINGS) ×1
APL PRP STRL LF DISP 70% ISPRP (MISCELLANEOUS) ×1
APPLIER CLIP 9.375 MED OPEN (MISCELLANEOUS) ×1
APR CLP MED 9.3 20 MLT OPN (MISCELLANEOUS) ×1
BINDER BREAST LRG (GAUZE/BANDAGES/DRESSINGS) IMPLANT
BINDER BREAST MEDIUM (GAUZE/BANDAGES/DRESSINGS) IMPLANT
BINDER BREAST XLRG (GAUZE/BANDAGES/DRESSINGS) IMPLANT
BINDER BREAST XXLRG (GAUZE/BANDAGES/DRESSINGS) IMPLANT
BLADE SURG 15 STRL LF DISP TIS (BLADE) ×2 IMPLANT
BLADE SURG 15 STRL SS (BLADE) ×1
CANISTER SUC SOCK COL 7IN (MISCELLANEOUS) IMPLANT
CANISTER SUCT 1200ML W/VALVE (MISCELLANEOUS) ×2 IMPLANT
CHLORAPREP W/TINT 26 (MISCELLANEOUS) ×1 IMPLANT
CLIP APPLIE 9.375 MED OPEN (MISCELLANEOUS) ×1 IMPLANT
COVER BACK TABLE 60X90IN (DRAPES) ×2 IMPLANT
COVER MAYO STAND STRL (DRAPES) ×1 IMPLANT
COVER PROBE CYLINDRICAL 5X96 (MISCELLANEOUS) ×2 IMPLANT
DERMABOND ADVANCED .7 DNX12 (GAUZE/BANDAGES/DRESSINGS) ×1 IMPLANT
DRAPE LAPAROSCOPIC ABDOMINAL (DRAPES) ×1 IMPLANT
DRAPE UTILITY XL STRL (DRAPES) ×2 IMPLANT
ELECT COATED BLADE 2.86 ST (ELECTRODE) ×2 IMPLANT
ELECT REM PT RETURN 9FT ADLT (ELECTROSURGICAL) ×1
ELECTRODE REM PT RTRN 9FT ADLT (ELECTROSURGICAL) ×1 IMPLANT
GLOVE BIOGEL PI IND STRL 8 (GLOVE) ×2 IMPLANT
GLOVE ECLIPSE 8.0 STRL XLNG CF (GLOVE) ×2 IMPLANT
GOWN STRL REUS W/ TWL LRG LVL3 (GOWN DISPOSABLE) ×4 IMPLANT
GOWN STRL REUS W/ TWL XL LVL3 (GOWN DISPOSABLE) ×2 IMPLANT
GOWN STRL REUS W/TWL LRG LVL3 (GOWN DISPOSABLE) ×2
GOWN STRL REUS W/TWL XL LVL3 (GOWN DISPOSABLE) ×1
HEMOSTAT ARISTA ABSORB 3G PWDR (HEMOSTASIS) IMPLANT
HEMOSTAT SNOW SURGICEL 2X4 (HEMOSTASIS) IMPLANT
KIT MARKER MARGIN INK (KITS) ×2 IMPLANT
NDL HYPO 25X1 1.5 SAFETY (NEEDLE) ×2 IMPLANT
NDL SAFETY ECLIP 18X1.5 (MISCELLANEOUS) IMPLANT
NEEDLE HYPO 25X1 1.5 SAFETY (NEEDLE) ×1 IMPLANT
NS IRRIG 1000ML POUR BTL (IV SOLUTION) ×1 IMPLANT
PACK BASIN DAY SURGERY FS (CUSTOM PROCEDURE TRAY) ×1 IMPLANT
PENCIL SMOKE EVACUATOR (MISCELLANEOUS) ×1 IMPLANT
SLEEVE SCD COMPRESS KNEE MED (STOCKING) ×2 IMPLANT
SPIKE FLUID TRANSFER (MISCELLANEOUS) IMPLANT
SPONGE T-LAP 4X18 ~~LOC~~+RFID (SPONGE) ×2 IMPLANT
SUT MNCRL AB 4-0 PS2 18 (SUTURE) ×2 IMPLANT
SUT VICRYL 3-0 CR8 SH (SUTURE) ×1 IMPLANT
SYR CONTROL 10ML LL (SYRINGE) ×1 IMPLANT
TOWEL GREEN STERILE FF (TOWEL DISPOSABLE) ×2 IMPLANT
TRACER MAGTRACE VIAL (MISCELLANEOUS) IMPLANT
TRAY FAXITRON CT DISP (TRAY / TRAY PROCEDURE) ×1 IMPLANT
TUBE CONNECTING 20X1/4 (TUBING) ×1 IMPLANT
YANKAUER SUCT BULB TIP NO VENT (SUCTIONS) ×2 IMPLANT

## 2023-02-06 NOTE — Discharge Instructions (Addendum)
[facility] Office Phone Number [phone]  BREAST BIOPSY/ PARTIAL MASTECTOMY: POST OP INSTRUCTIONS  Always review your discharge instruction sheet given to you by the facility where your surgery was performed.  IF YOU HAVE DISABILITY OR FAMILY LEAVE FORMS, YOU MUST BRING THEM TO THE OFFICE FOR PROCESSING.  DO NOT GIVE THEM TO YOUR DOCTOR.  A prescription for pain medication may be given to you upon discharge.  Take your pain medication as prescribed, if needed.  If narcotic pain medicine is not needed, then you may take acetaminophen (Tylenol) or ibuprofen (Advil) as needed. Take your usually prescribed medications unless otherwise directed If you need a refill on your pain medication, please contact your pharmacy.  They will contact our office to request authorization.  Prescriptions will not be filled after 5pm or on week-ends. You should eat very light the first 24 hours after surgery, such as soup, crackers, pudding, etc.  Resume your normal diet the day after surgery. Most patients will experience some swelling and bruising in the breast.  Ice packs and a good support bra will help.  Swelling and bruising can take several days to resolve.  It is common to experience some constipation if taking pain medication after surgery.  Increasing fluid intake and taking a stool softener will usually help or prevent this problem from occurring.  A mild laxative (Milk of Magnesia or Miralax) should be taken according to package directions if there are no bowel movements after 48 hours. Unless discharge instructions indicate otherwise, you may remove your bandages 24-48 hours after surgery, and you may shower at that time.  You may have steri-strips (small skin tapes) in place directly over the incision.  These strips should be left on the skin for 7-10 days.  If your surgeon used skin glue on the incision, you may shower in 24 hours.  The glue will flake off over the next 2-3 weeks.  Any  sutures or staples will be removed at the office during your follow-up visit. ACTIVITIES:  You may resume regular daily activities (gradually increasing) beginning the next day.  Wearing a good support bra or sports bra minimizes pain and swelling.  You may have sexual intercourse when it is comfortable. You may drive when you no longer are taking prescription pain medication, you can comfortably wear a seatbelt, and you can safely maneuver your car and apply brakes. RETURN TO WORK:  ______________________________________________________________________________________ Brittany Collier should see your doctor in the office for a follow-up appointment approximately two weeks after your surgery.  Your doctor's nurse will typically make your follow-up appointment when she calls you with your pathology report.  Expect your pathology report 2-3 business days after your surgery.  You may call to check if you do not hear from [place] after three days. OTHER INSTRUCTIONS: _______________________________________________________________________________________________ _____________________________________________________________________________________________________________________________________ _____________________________________________________________________________________________________________________________________ _____________________________________________________________________________________________________________________________________  WHEN TO CALL YOUR DOCTOR: Fever over 101.0 Nausea and/or vomiting. Extreme swelling or bruising. Continued bleeding from incision. Increased pain, redness, or drainage from the incision.  The clinic staff is available to answer your questions during regular business hours.  Please don't hesitate to call and ask to speak to one of the nurses for clinical concerns.  If you have a medical emergency, go to the nearest emergency room or call 911.  A surgeon from [facility] is always on call at the hospital.  For further questions, please visit [email or web address]    No Tylenol until 12:45   Post Anesthesia Home Care Instructions  Activity: Get plenty  of rest for the remainder of the day. A responsible individual must stay with you for 24 hours following the procedure.  For the next 24 hours, DO NOT: -Drive a car -Advertising copywriter -Drink alcoholic beverages -Take any medication unless instructed by your physician -Make any legal decisions or sign important papers.  Meals: Start with liquid foods such as gelatin or soup. Progress to regular foods as tolerated. Avoid greasy, spicy, heavy foods. If nausea and/or vomiting occur, drink only clear liquids until the nausea and/or vomiting subsides. Call your physician if vomiting continues.  Special Instructions/Symptoms: Your throat may feel dry or sore from the anesthesia or the breathing tube placed in your throat during surgery. If this causes discomfort, gargle with warm salt water. The discomfort should disappear within 24 hours.  If you had a scopolamine patch placed behind your ear for the management of post- operative nausea and/or vomiting:  1. The medication in the patch is effective for 72 hours, after which it should be removed.  Wrap patch in a tissue and discard in the trash. Wash hands thoroughly with soap and water. 2. You may remove the patch earlier than 72 hours if you experience unpleasant side effects which may include dry mouth, dizziness or visual disturbances. 3. Avoid touching the patch. Wash your hands with soap and water after contact with the patch.

## 2023-02-06 NOTE — Anesthesia Procedure Notes (Signed)
Procedure Name: LMA Insertion Date/Time: 02/06/2023 7:45 AM  Performed by: Garth Bigness, CRNAPre-anesthesia Checklist: Patient identified, Emergency Drugs available, Suction available and Patient being monitored Patient Re-evaluated:Patient Re-evaluated prior to induction Oxygen Delivery Method: Circle system utilized Preoxygenation: Pre-oxygenation with 100% oxygen Induction Type: IV induction Ventilation: Mask ventilation without difficulty LMA: LMA inserted LMA Size: 4.0 Tube type: Oral Number of attempts: 1 Placement Confirmation: positive ETCO2 and breath sounds checked- equal and bilateral Tube secured with: Tape Dental Injury: Teeth and Oropharynx as per pre-operative assessment

## 2023-02-06 NOTE — Anesthesia Procedure Notes (Signed)
Anesthesia Regional Block: Pectoralis block   Pre-Anesthetic Checklist: , timeout performed,  Correct Patient, Correct Site, Correct Laterality,  Correct Procedure, Correct Position, site marked,  Risks and benefits discussed,  Pre-op evaluation,  At surgeon's request and post-op pain management  Laterality: Right  Prep: Maximum Sterile Barrier Precautions used, chloraprep       Needles:  Injection technique: Single-shot  Needle Type: Echogenic Stimulator Needle     Needle Length: 9cm  Needle Gauge: 22     Additional Needles:   Procedures:,,,, ultrasound used (permanent image in chart),,    Narrative:  Start time: 02/06/2023 7:13 AM End time: 02/06/2023 7:16 AM Injection made incrementally with aspirations every 5 mL.  Performed by: Personally  Anesthesiologist: Kaylyn Layer, MD  Additional Notes: Risks, benefits, and alternative discussed. Patient gave consent for procedure. Patient prepped and draped in sterile fashion. Sedation administered, patient remains easily responsive to voice. Relevant anatomy identified with ultrasound guidance. Local anesthetic given in 5cc increments with no signs or symptoms of intravascular injection. No pain or paraesthesias with injection. Patient monitored throughout procedure with signs of LAST or immediate complications. Tolerated well. Ultrasound image placed in chart.  Amalia Greenhouse, MD

## 2023-02-06 NOTE — Anesthesia Postprocedure Evaluation (Signed)
Anesthesia Post Note  Patient: Obera Blaydes Pickrell  Procedure(s) Performed: RIGHT BREAST LUMPECTOMY WITH RADIOACTIVE SEED AND SENTINEL LYMPH NODE BIOPSY (Right: Breast)     Patient location during evaluation: PACU Anesthesia Type: General Level of consciousness: awake and alert Pain management: pain level controlled Vital Signs Assessment: post-procedure vital signs reviewed and stable Respiratory status: spontaneous breathing, nonlabored ventilation and respiratory function stable Cardiovascular status: blood pressure returned to baseline Postop Assessment: no apparent nausea or vomiting Anesthetic complications: no   No notable events documented.  Last Vitals:  Vitals:   02/06/23 0915 02/06/23 0930  BP: 117/78 129/80  Pulse: 69 (!) 59  Resp: 16 17  Temp:    SpO2: 100% 100%    Last Pain:  Vitals:   02/06/23 0915  PainSc: 0-No pain                 Shanda Howells

## 2023-02-06 NOTE — Progress Notes (Signed)
Assisted Dr. Howze with right, pectoralis, ultrasound guided block. Side rails up, monitors on throughout procedure. See vital signs in flow sheet. Tolerated Procedure well. 

## 2023-02-06 NOTE — Op Note (Signed)
Preoperative diagnosis: Stage I right breast cancer ER positive upper outer quadrant  Postoperative diagnosis: Same  Procedure: Right breast seed localized lumpectomy with right axillary sentinel lymph node mapping of deep right axillary sentinel lymph nodes using mag trace  Surgeon: Harriette Bouillon, MD  Anesthesia: LMA with 0.25% Marcaine plain with pectoral block  Drains: None  Specimen: Right breast tissue with seed and clip verified by Faxitron plus additional margins and 3 right axillary lymph nodes  Indications for procedure: The patient is a 72 year old female with stage I right breast cancer.  Her case was reviewed in the [facility] and the pros and cons of surgery were reviewed as well as different surgical options.  She opted for right breast seed localized lumpectomy with right axillary sentinel lymph node mapping after reviewing her options.The procedure has been discussed with the patient. Alternatives to surgery have been discussed with the patient.  Risks of surgery include bleeding,  Infection,  Seroma formation, death,  and the need for further surgery.   The patient understands and wishes to proceed. Sentinel lymph node mapping and dissection has been discussed with the patient.  Risk of bleeding,  Infection,  Seroma formation,  Additional procedures,,  Shoulder weakness , arm swelling shoulder stiffness,  Nerve and blood vessel injury and reaction to the mapping dyes have been discussed.  Alternatives to surgery have been discussed with the patient.  The patient agrees to proceed.    Description of procedure: The patient was met in the holding area questions were answered.  Of note his seed was placed as an outpatient in the right breast and films were available for review.  All questions were answered.  Pectoral block placed by anesthesia.  She was placed supine upon the operating room table.  After induction of general esthesia, the right breast was prepped and 2 cc of MAC tracer  injected after timeout and the right subareolar plexus and massaged for 5 minutes.  She was then prepped and draped in sterile fashion right.  A second timeout performed.  Neoprobe used to identify the seed in the right breast upper outer quadrant.  A curvilinear incision was made of the signal and dissection was carried down with all tissue and the seed and clip were excised with grossly negative margins.  The Faxitron image revealed seed and clip to be present.  I then took additional margin shaved from each margin and these were oriented with ink and sent to pathology.  Hemostasis achieved.  Clips were used to mark the cavity and local anesthetic infiltrated.  Deep tissue planes were approximated with 3-0 Vicryl.  4 Monocryl used to close the skin in a subcuticular fashion.  Mag trace probe was used to identify hotspot in her axilla.  A 3 cm incision was made along the inferior border of the right axillary hairline.  Dissection was carried down into the level 1 deep contents.  There were 3 nodes that had taken tracer.  Background counts approached baseline.  The long thoracic nerve, thoracodorsal trunk and extra vein were preserved.  Irrigation was used and hemostasis achieved with cautery and Arista.  Deep tissue planes were approximated with 3-0 Vicryl.  4 Monocryl was used to close the skin in a subcuticular fashion.  Dermabond applied.  All counts found to be correct.  The patient was awoke extubated taken to recovery in satisfactory condition.

## 2023-02-06 NOTE — Transfer of Care (Signed)
Immediate Anesthesia Transfer of Care Note  Patient: Brittany Collier  Procedure(s) Performed: RIGHT BREAST LUMPECTOMY WITH RADIOACTIVE SEED AND SENTINEL LYMPH NODE BIOPSY (Right: Breast)  Patient Location: PACU  Anesthesia Type:General  Level of Consciousness: awake, alert , oriented, and patient cooperative  Airway & Oxygen Therapy: Patient Spontanous Breathing and Patient connected to face mask oxygen  Post-op Assessment: Report given to RN and Post -op Vital signs reviewed and stable  Post vital signs: Reviewed and stable  Last Vitals:  Vitals Value Taken Time  BP    Temp    Pulse 66 02/06/23 0910  Resp 13 02/06/23 0910  SpO2 100 % 02/06/23 0910    Last Pain:  Vitals:   02/06/23 0639  PainSc: 0-No pain      Patients Stated Pain Goal: 3 (02/06/23 0639)  Complications: No notable events documented.

## 2023-02-07 ENCOUNTER — Encounter (HOSPITAL_BASED_OUTPATIENT_CLINIC_OR_DEPARTMENT_OTHER): Payer: Self-pay | Admitting: Surgery

## 2023-02-08 DIAGNOSIS — C50911 Malignant neoplasm of unspecified site of right female breast: Secondary | ICD-10-CM | POA: Diagnosis not present

## 2023-02-09 LAB — SURGICAL PATHOLOGY

## 2023-02-10 ENCOUNTER — Encounter: Payer: Self-pay | Admitting: Surgery

## 2023-02-13 ENCOUNTER — Encounter: Payer: Self-pay | Admitting: *Deleted

## 2023-02-13 ENCOUNTER — Telehealth: Payer: Self-pay | Admitting: *Deleted

## 2023-02-13 NOTE — Telephone Encounter (Signed)
Ordered oncotype per Dr. Feng. Requisition sent to pathology and exact sciences. 

## 2023-03-02 ENCOUNTER — Telehealth: Payer: Self-pay | Admitting: *Deleted

## 2023-03-02 ENCOUNTER — Encounter (HOSPITAL_COMMUNITY): Payer: Self-pay

## 2023-03-02 DIAGNOSIS — C50411 Malignant neoplasm of upper-outer quadrant of right female breast: Secondary | ICD-10-CM | POA: Diagnosis not present

## 2023-03-02 DIAGNOSIS — Z170 Estrogen receptor positive status [ER+]: Secondary | ICD-10-CM | POA: Diagnosis not present

## 2023-03-02 NOTE — Telephone Encounter (Signed)
Spoke with exact sciences for an update on patient's results release date.  Was informed that they are waiting on the prior auth from her insurance and would send for an update.

## 2023-03-03 ENCOUNTER — Encounter (HOSPITAL_COMMUNITY): Payer: Self-pay

## 2023-03-06 ENCOUNTER — Encounter: Payer: Self-pay | Admitting: *Deleted

## 2023-03-06 ENCOUNTER — Telehealth: Payer: Self-pay | Admitting: *Deleted

## 2023-03-06 DIAGNOSIS — Z170 Estrogen receptor positive status [ER+]: Secondary | ICD-10-CM

## 2023-03-06 NOTE — Telephone Encounter (Signed)
Received oncotype results of 5/3%. Left message on patient's identified VM.  Referral placed for Dr. Mitzi Hansen.

## 2023-03-10 ENCOUNTER — Encounter: Payer: Self-pay | Admitting: *Deleted

## 2023-03-13 NOTE — Progress Notes (Signed)
Radiation Oncology         [phone] ________________________________  Name: Brittany Collier        MRN: [id number]  Date of Service: 03/15/2023 DOB: 02-07-1951  CC:Henrine Screws, MD  Malachy Mood, MD     REFERRING PHYSICIAN: Malachy Mood, MD   DIAGNOSIS: The encounter diagnosis was Malignant neoplasm of upper-outer quadrant of right breast in female, estrogen receptor positive (HCC).   HISTORY OF PRESENT ILLNESS: Brittany Collier is a 72 y.o. female originally seen in the multidisciplinary breast clinic for a new diagnosis of right breast cancer. The patient was noted to have a right breast abnormality detected on screening mammogram. Diagnostic mammogram and U/S showed two masses measuring 1.9 cm and 0.9 cm in the right breast both in the 10 o'clock position along with indeterminate right axillary node lymph node with borderline thickening. A biopsy on 01/04/23  revealed grade 2 invasive ductal carcinoma. Biopsy on axilla was unable to be obtained. Her cancer was ER/PR positive, Her2 negative and Ki-67 was 10%.   She is seen today to discuss treatment recommendations of her cancer.  Since her last visit, she has undergone a right lumpectomy with sentinel lymph node biopsy on 02/06/2023 that showed a grade 2 invasive ductal carcinoma with 2 distinct tumors and one of them measuring 1.5 cm in greatest dimension and the other 8 mm in greatest dimension, there was also extensive intermediate grade DCIS adjacent to away from any between both tumors and DCIS was noted to be less than 0.1 mm from both the new anterior and medial margin after additional margins were submitted and the remainder of her margins were negative.  Of the 6 lymph nodes within the specimen (one being intramammary), none contained metastatic disease.  Oncotype DX score was ordered and performed her recurrence score is 5 send no systemic chemo is recommended.  She is seen today to discuss adjuvant radiotherapy.    PREVIOUS  RADIATION THERAPY: No   PAST MEDICAL HISTORY:  Past Medical History:  Diagnosis Date   Anxiety    Hypertension        PAST SURGICAL HISTORY: Past Surgical History:  Procedure Laterality Date   BREAST BIOPSY Right 01/04/2023   [place] RT BREAST BX W LOC DEV 1ST LESION IMG BX SPEC US GUIDE 01/04/2023 [facility]   BREAST BIOPSY  02/03/2023   [place] RT RADIOACTIVE SEED LOC 02/03/2023 [facility]   BREAST LUMPECTOMY WITH RADIOACTIVE SEED AND SENTINEL LYMPH NODE BIOPSY Right 02/06/2023   Procedure: RIGHT BREAST LUMPECTOMY WITH RADIOACTIVE SEED AND SENTINEL LYMPH NODE BIOPSY;  Surgeon: Harriette Bouillon, MD;  Location: [facility];  Service: General;  Laterality: Right;  PEC BLOCK   HAND SURGERY Right    finger release     FAMILY HISTORY:  Family History  Problem Relation Age of Onset   Arthritis Mother    Diabetes Mother      SOCIAL HISTORY:  reports that she has never smoked. She has never been exposed to tobacco smoke. She has never used smokeless tobacco. She reports current alcohol use. She reports that she does not use drugs.  The patient is married and lives in [place].  She***   ALLERGIES: Penicillins   MEDICATIONS:  Current Outpatient Medications  Medication Sig Dispense Refill   ALPRAZolam (XANAX XR) 1 MG 24 hr tablet Take 1 mg 2 (two) times daily by mouth. MORNING and EVENING     ALPRAZolam (XANAX) 0.5 MG tablet Take 0.5 mg by  mouth 2 (two) times daily. MORNING and BEDTIME     amLODipine (NORVASC) 2.5 MG tablet Take 2.5 mg by mouth daily.     hydrochlorothiazide (HYDRODIURIL) 25 MG tablet Take 25 mg by mouth daily.  0   Melatonin 5 MG TABS Take 10 mg at bedtime by mouth.     oxyCODONE (OXY IR/ROXICODONE) 5 MG immediate release tablet Take 1 tablet (5 mg total) by mouth every 6 (six) hours as needed for severe pain. 15 tablet 0   No current facility-administered medications for this visit.     REVIEW OF SYSTEMS: On review of systems, the patient  reports that she is doing ***     PHYSICAL EXAM:  Wt Readings from Last 3 Encounters:  02/06/23 168 lb 6.9 oz (76.4 kg)  01/11/23 173 lb 12.8 oz (78.8 kg)  12/13/22 176 lb (79.8 kg)   Temp Readings from Last 3 Encounters:  02/06/23 (!) 97.2 F (36.2 C)  01/11/23 98.3 F (36.8 C) (Oral)  11/04/22 97.8 F (36.6 C)   BP Readings from Last 3 Encounters:  02/06/23 (!) 134/93  01/11/23 (!) 143/89  12/13/22 (!) 166/90   Pulse Readings from Last 3 Encounters:  02/06/23 (!) 52  01/11/23 (!) 57  12/13/22 (!) 50    In general this is a well appearing female in no acute distress. She's alert and oriented x4 and appropriate throughout the examination. Cardiopulmonary assessment is negative for acute distress and she exhibits normal effort.  Her right breast and axillary incision sites are well-healed without erythema, separation or drainage.    ECOG = 0  0 - Asymptomatic (Fully active, able to carry on all predisease activities without restriction)  1 - Symptomatic but completely ambulatory (Restricted in physically strenuous activity but ambulatory and able to carry out work of a light or sedentary nature. For example, light housework, office work)  2 - Symptomatic, <50% in bed during the day (Ambulatory and capable of all self care but unable to carry out any work activities. Up and about more than 50% of waking hours)  3 - Symptomatic, >50% in bed, but not bedbound (Capable of only limited self-care, confined to bed or chair 50% or more of waking hours)  4 - Bedbound (Completely disabled. Cannot carry on any self-care. Totally confined to bed or chair)  5 - Death   Santiago Glad MM, Creech RH, Tormey DC, et al. [phone]). "Toxicity and response criteria of the [facility]". Am. Evlyn Clines. Oncol. 5 (6): 649-55    LABORATORY DATA:  Lab Results  Component Value Date   WBC 3.7 (L) 01/11/2023   HGB 13.3 01/11/2023   HCT 38.7 01/11/2023   MCV 92.1 01/11/2023   PLT  199 01/11/2023   Lab Results  Component Value Date   NA 135 01/11/2023   K 3.8 01/11/2023   CL 101 01/11/2023   CO2 28 01/11/2023   Lab Results  Component Value Date   ALT 9 01/11/2023   AST 17 01/11/2023   ALKPHOS 46 01/11/2023   BILITOT 0.9 01/11/2023      RADIOGRAPHY: No results found.     IMPRESSION/PLAN: 1.  Stage IA, [id number], grade 2,  ER/PR positive Invasive Ductal Carcinoma of the right breast. Dr. Mitzi Hansen has reviewed her final pathology findings and today she and I reviewed the nature of early stage breast disease.  She has done well since surgery.  She did have close margins for DCIS and hence should proceed with radiation to  reduce risks of local recurrence.  We discussed the risks, benefits, short, and long term effects of radiotherapy, as well as the curative intent, and the patient is interested in proceeding. We reviewed the delivery and logistics of radiotherapy and Dr. Mitzi Hansen recommends 4 weeks of radiotherapy to the right breast. Written consent is obtained and placed in the chart, a copy was provided to the patient. She will simulate on Friday this week. ***   In a visit lasting *** minutes, greater than 50% of the time was spent face to face discussing the patient's condition, in preparation for the discussion, and coordinating the patient's care.    Osker Mason, [facility]    **Disclaimer: This note was dictated with voice recognition software. Similar sounding words can inadvertently be transcribed and this note may contain transcription errors which may not have been corrected upon publication of note.**

## 2023-03-15 ENCOUNTER — Ambulatory Visit
Admission: RE | Admit: 2023-03-15 | Discharge: 2023-03-15 | Disposition: A | Discharge status: Home / Self Care | Payer: Medicare PPO | Source: Ambulatory Visit | Attending: Radiation Oncology | Admitting: Radiation Oncology

## 2023-03-15 ENCOUNTER — Encounter: Payer: Self-pay | Admitting: Radiation Oncology

## 2023-03-15 ENCOUNTER — Other Ambulatory Visit: Payer: Self-pay

## 2023-03-15 ENCOUNTER — Encounter: Payer: Self-pay | Admitting: *Deleted

## 2023-03-15 VITALS — BP 155/91 | HR 56 | Temp 97.8°F | Resp 18 | Ht 64.0 in | Wt 160.2 lb

## 2023-03-15 DIAGNOSIS — I10 Essential (primary) hypertension: Secondary | ICD-10-CM | POA: Insufficient documentation

## 2023-03-15 DIAGNOSIS — Z170 Estrogen receptor positive status [ER+]: Secondary | ICD-10-CM | POA: Insufficient documentation

## 2023-03-15 DIAGNOSIS — Z79899 Other long term (current) drug therapy: Secondary | ICD-10-CM | POA: Diagnosis not present

## 2023-03-15 DIAGNOSIS — C50411 Malignant neoplasm of upper-outer quadrant of right female breast: Secondary | ICD-10-CM | POA: Diagnosis not present

## 2023-03-15 NOTE — Progress Notes (Signed)
Nursing interview for Malignant neoplasm of upper-outer quadrant of right breast in female, estrogen receptor positive (HCC). Patient identity verified x2.  Patient reports well. No other issues conveyed at this time.  Meaningful use complete.  Vitals- BP (!) 155/91 (BP Location: Right Arm, Patient Position: Sitting, Cuff Size: Normal)   Pulse (!) 56   Temp 97.8 F (36.6 C) (Temporal)   Resp 18   Ht 5\' 4"  (1.626 m)   Wt 160 lb 3.2 oz (72.7 kg)   SpO2 100%   BMI 27.50 kg/m   This concludes the interaction.  Ruel Favors, LPN

## 2023-03-16 ENCOUNTER — Telehealth: Payer: Self-pay | Admitting: Hematology

## 2023-03-17 ENCOUNTER — Telehealth: Payer: Self-pay | Admitting: Hematology

## 2023-03-17 ENCOUNTER — Ambulatory Visit: Payer: Medicare PPO | Admitting: Radiation Oncology

## 2023-03-20 ENCOUNTER — Encounter: Payer: Self-pay | Admitting: *Deleted

## 2023-03-29 ENCOUNTER — Other Ambulatory Visit: Payer: Medicare PPO

## 2023-03-29 ENCOUNTER — Ambulatory Visit: Payer: Medicare PPO | Admitting: Hematology

## 2023-04-06 DIAGNOSIS — F410 Panic disorder [episodic paroxysmal anxiety] without agoraphobia: Secondary | ICD-10-CM | POA: Diagnosis not present

## 2023-04-10 ENCOUNTER — Ambulatory Visit: Payer: Medicare PPO | Admitting: Internal Medicine

## 2023-05-05 DIAGNOSIS — M1712 Unilateral primary osteoarthritis, left knee: Secondary | ICD-10-CM | POA: Diagnosis not present

## 2023-05-05 DIAGNOSIS — I10 Essential (primary) hypertension: Secondary | ICD-10-CM | POA: Diagnosis not present

## 2023-05-05 DIAGNOSIS — E782 Mixed hyperlipidemia: Secondary | ICD-10-CM | POA: Diagnosis not present

## 2023-05-05 DIAGNOSIS — Z0000 Encounter for general adult medical examination without abnormal findings: Secondary | ICD-10-CM | POA: Diagnosis not present

## 2023-05-05 DIAGNOSIS — E042 Nontoxic multinodular goiter: Secondary | ICD-10-CM | POA: Diagnosis not present

## 2023-05-05 DIAGNOSIS — D72819 Decreased white blood cell count, unspecified: Secondary | ICD-10-CM | POA: Diagnosis not present

## 2023-09-04 DIAGNOSIS — F410 Panic disorder [episodic paroxysmal anxiety] without agoraphobia: Secondary | ICD-10-CM | POA: Diagnosis not present

## 2024-02-05 DIAGNOSIS — F410 Panic disorder [episodic paroxysmal anxiety] without agoraphobia: Secondary | ICD-10-CM | POA: Diagnosis not present

## 2024-04-24 ENCOUNTER — Emergency Department (HOSPITAL_BASED_OUTPATIENT_CLINIC_OR_DEPARTMENT_OTHER)

## 2024-04-24 ENCOUNTER — Other Ambulatory Visit: Payer: Self-pay

## 2024-04-24 ENCOUNTER — Encounter (HOSPITAL_BASED_OUTPATIENT_CLINIC_OR_DEPARTMENT_OTHER): Payer: Self-pay

## 2024-04-24 ENCOUNTER — Emergency Department (HOSPITAL_BASED_OUTPATIENT_CLINIC_OR_DEPARTMENT_OTHER)
Admission: EM | Admit: 2024-04-24 | Discharge: 2024-04-24 | Disposition: A | Discharge status: Home / Self Care | Attending: Emergency Medicine | Admitting: Emergency Medicine

## 2024-04-24 ENCOUNTER — Emergency Department (HOSPITAL_BASED_OUTPATIENT_CLINIC_OR_DEPARTMENT_OTHER): Admitting: Radiology

## 2024-04-24 DIAGNOSIS — R5383 Other fatigue: Secondary | ICD-10-CM | POA: Insufficient documentation

## 2024-04-24 DIAGNOSIS — R531 Weakness: Secondary | ICD-10-CM | POA: Insufficient documentation

## 2024-04-24 DIAGNOSIS — I10 Essential (primary) hypertension: Secondary | ICD-10-CM | POA: Insufficient documentation

## 2024-04-24 DIAGNOSIS — E876 Hypokalemia: Secondary | ICD-10-CM | POA: Insufficient documentation

## 2024-04-24 DIAGNOSIS — Z79899 Other long term (current) drug therapy: Secondary | ICD-10-CM | POA: Insufficient documentation

## 2024-04-24 LAB — TROPONIN T, HIGH SENSITIVITY: Troponin T High Sensitivity: <15 ng/L (ref 0–19)

## 2024-04-24 LAB — COMPREHENSIVE METABOLIC PANEL WITH GFR
ALT: 15 U/L (ref 0–44)
AST: 27 U/L (ref 15–41)
Albumin: 4.6 g/dL (ref 3.5–5.0)
Alkaline Phosphatase: 74 U/L (ref 38–126)
Anion gap: 13 (ref 5–15)
BUN: 26 mg/dL — ABNORMAL HIGH (ref 8–23)
CO2: 25 mmol/L (ref 22–32)
Calcium: 10.5 mg/dL — ABNORMAL HIGH (ref 8.9–10.3)
Chloride: 94 mmol/L — ABNORMAL LOW (ref 98–111)
Creatinine, Ser: 1.03 mg/dL — ABNORMAL HIGH (ref 0.44–1.00)
GFR, Estimated: 57 mL/min — ABNORMAL LOW (ref >60)
Glucose, Bld: 98 mg/dL (ref 70–99)
Potassium: 3.3 mmol/L — ABNORMAL LOW (ref 3.5–5.1)
Sodium: 133 mmol/L — ABNORMAL LOW (ref 135–145)
Total Bilirubin: 0.8 mg/dL (ref 0.0–1.2)
Total Protein: 7.5 g/dL (ref 6.5–8.1)

## 2024-04-24 LAB — CBC WITH DIFFERENTIAL/PLATELET
Abs Immature Granulocytes: 0.01 K/uL (ref 0.00–0.07)
Basophils Absolute: 0 K/uL (ref 0.0–0.1)
Basophils Relative: 1 %
Eosinophils Absolute: 0.1 K/uL (ref 0.0–0.5)
Eosinophils Relative: 1 %
HCT: 39.1 % (ref 36.0–46.0)
Hemoglobin: 13.4 g/dL (ref 12.0–15.0)
Immature Granulocytes: 0 %
Lymphocytes Relative: 33 %
Lymphs Abs: 1.3 K/uL (ref 0.7–4.0)
MCH: 30.3 pg (ref 26.0–34.0)
MCHC: 34.3 g/dL (ref 30.0–36.0)
MCV: 88.5 fL (ref 80.0–100.0)
Monocytes Absolute: 0.3 K/uL (ref 0.1–1.0)
Monocytes Relative: 8 %
Neutro Abs: 2.3 K/uL (ref 1.7–7.7)
Neutrophils Relative %: 57 %
Platelets: 189 K/uL (ref 150–400)
RBC: 4.42 MIL/uL (ref 3.87–5.11)
RDW: 13.4 % (ref 11.5–15.5)
WBC: 4 K/uL (ref 4.0–10.5)
nRBC: 0 % (ref 0.0–0.2)

## 2024-04-24 LAB — URINALYSIS, ROUTINE W REFLEX MICROSCOPIC
Bilirubin Urine: NEGATIVE
Glucose, UA: NEGATIVE mg/dL
Hgb urine dipstick: NEGATIVE
Ketones, ur: NEGATIVE mg/dL
Leukocytes,Ua: NEGATIVE
Nitrite: NEGATIVE
Protein, ur: NEGATIVE mg/dL
Specific Gravity, Urine: 1.01 (ref 1.005–1.030)
pH: 6 (ref 5.0–8.0)

## 2024-04-24 LAB — CBG MONITORING, ED: Glucose-Capillary: 99 mg/dL (ref 70–99)

## 2024-04-24 NOTE — ED Notes (Signed)
 Introduced myself to patient Patient has refused the xray. States she is here to get her electrolytes checked out because she had been feeling dizzy States she tries to stay away from radiation.

## 2024-04-24 NOTE — ED Triage Notes (Signed)
 Pt states she has been feeling weak and dizzy for 2 months .  Her PCP retired and is having to find a new one.

## 2024-04-24 NOTE — ED Notes (Signed)
 Pt states that she doesn't want to have any xrays or any other testing at this time and she will follow up with her PMD. Provider aware

## 2024-04-24 NOTE — ED Notes (Signed)
 Radiology advised the went to get pt from lobby for CT but pt refused stating she did not want the radiation from the CT.

## 2024-04-24 NOTE — Discharge Instructions (Signed)
 Make sure you are drinking plenty of fluids.  Eat a healthy diet.  Try drinking an ensure a day t help with nutrition.  Take a multivitamin daily.  Schedule appointment with primary care for complete evaluation

## 2024-04-24 NOTE — ED Provider Notes (Signed)
 [place] EMERGENCY DEPARTMENT AT [facility] Provider Note   CSN: [id number] Arrival date & time: 04/24/24  1533     Patient presents with: Weakness and Dizziness   Brittany Collier is a 73 y.o. female.   Patient complains of weakness and fatigue for the past 2 months.  Patient states that she has had decreased energy.  Patient has not been able to see her primary care physician because he retired.  Patient is trying to obtain a new primary care physician.  Patient has a past medical history of hypertension and anxiety.  Patient states she is taking Xanax , amlodipine and hydrochlorothiazide .  Patient became concerned today that she had an electrolyte abnormality.  Patient states that she felt better yesterday after eating ice cream.  Patient states that she drank a Gatorade today and this made her feel better.  Patient states she has had a history of electrolyte abnormalities in the past and wanted to get them checked.  Patient denies any fever she denies any chills.  Patient denies any weight loss she denies any weight gain she has not had any cough or congestion.  Patient denies any abdominal pain she is not having any headaches.  The history is provided by the patient. No language interpreter was used.  Weakness Associated symptoms: dizziness   Dizziness Associated symptoms: weakness        Prior to Admission medications   Medication Sig Start Date End Date Taking? Authorizing Provider  ALPRAZolam  (XANAX  XR) 1 MG 24 hr tablet Take 1 mg 2 (two) times daily by mouth. MORNING and EVENING    [provider]  ALPRAZolam  (XANAX ) 0.5 MG tablet Take 0.5 mg by mouth 2 (two) times daily. MORNING and BEDTIME    [provider]  amLODipine (NORVASC) 2.5 MG tablet Take 2.5 mg by mouth daily.    [provider]  hydrochlorothiazide  (HYDRODIURIL ) 25 MG tablet Take 25 mg by mouth daily. 09/17/14   [provider]  Melatonin 5 MG TABS Take 10 mg at bedtime  by mouth.    [provider]    Allergies: Penicillins    Review of Systems  Neurological:  Positive for dizziness and weakness.  All other systems reviewed and are negative.   Updated Vital Signs BP (!) 142/78   Pulse 60   Temp 98 F (36.7 C) (Oral)   Resp 15   Ht 5' 4 (1.626 m)   Wt 68 kg   SpO2 99%   BMI 25.75 kg/m   Physical Exam Vitals and nursing note reviewed.  Constitutional:      Appearance: She is well-developed.  HENT:     Head: Normocephalic.     Nose: Nose normal.     Mouth/Throat:     Mouth: Mucous membranes are moist.  Eyes:     Extraocular Movements: Extraocular movements intact.     Pupils: Pupils are equal, round, and reactive to light.  Cardiovascular:     Rate and Rhythm: Normal rate.  Pulmonary:     Effort: Pulmonary effort is normal.  Abdominal:     General: Abdomen is flat. There is no distension.  Musculoskeletal:        General: Normal range of motion.     Cervical back: Normal range of motion.  Skin:    General: Skin is warm.  Neurological:     General: No focal deficit present.     Mental Status: She is alert and oriented to person, place, and time.  Psychiatric:        Mood and Affect: Mood normal.     (all labs ordered are listed, but only abnormal results are displayed) Labs Reviewed  COMPREHENSIVE METABOLIC PANEL WITH GFR - Abnormal; Notable for the following components:      Result Value   Sodium 133 (*)    Potassium 3.3 (*)    Chloride 94 (*)    BUN 26 (*)    Creatinine, Ser 1.03 (*)    Calcium 10.5 (*)    GFR, Estimated 57 (*)    All other components within normal limits  URINALYSIS, ROUTINE W REFLEX MICROSCOPIC - Abnormal; Notable for the following components:   Color, Urine COLORLESS (*)    All other components within normal limits  CBC WITH DIFFERENTIAL/PLATELET  CBG MONITORING, ED  TROPONIN T, HIGH SENSITIVITY    EKG: EKG Interpretation Date/Time:  Wednesday April 24 2024 15:49:59  EDT Ventricular Rate:  58 PR Interval:  246 QRS Duration:  80 QT Interval:  412 QTC Calculation: 404 R Axis:   -37  Text Interpretation: Sinus bradycardia with 1st degree A-V block Left axis deviation When compared with ECG of 04-Nov-2022 15:50, PREVIOUS ECG IS PRESENT Confirmed by Ruthe Cornet [phone]) on 04/24/2024 3:52:05 PM  Radiology: No results found.   Procedures   Medications Ordered in the ED - No data to display                                  Medical Decision Making Patient complains of weakness and fatigue.  Patient reports the symptoms began 2 months ago  Amount and/or Complexity of Data Reviewed External Data Reviewed: notes.    Details: Primary care notes reviewed Labs: ordered. Decision-making details documented in ED Course.    Details: Labs ordered reviewed and interpreted.  Sodium is 133 potassium is 3.3.  Patient's GFR is 57. Radiology: ordered.    Details: Patient refused chest x-ray. ECG/medicine tests: ordered and independent interpretation performed. Decision-making details documented in ED Course.    Details: EKG shows sinus bradycardia otherwise no acute findings  Risk Risk Details: I discussed patient's electrolytes with her.  Patient is advised to try drinking an Ensure to help with her symptoms.  Patient is advised to take a multivitamin.  Patient is advised she should see her primary care physician for a complete physical and evaluation.  Patient has decided she does not want further evaluation of her symptoms.  Patient is discharged with follow-up instructions.        Final diagnoses:  Weakness    ED Discharge Orders     None      An After Visit Summary was printed and given to the patient.     Flint Sonny POUR, PA-C 04/24/24 2100    Ruthe Cornet, DO 04/24/24 2241

## 2024-06-03 ENCOUNTER — Ambulatory Visit (HOSPITAL_BASED_OUTPATIENT_CLINIC_OR_DEPARTMENT_OTHER): Admitting: Family Medicine

## 2024-06-03 ENCOUNTER — Encounter (HOSPITAL_BASED_OUTPATIENT_CLINIC_OR_DEPARTMENT_OTHER): Payer: Self-pay | Admitting: Family Medicine

## 2024-06-03 VITALS — BP 159/106 | HR 60 | Temp 98.4°F | Resp 18

## 2024-06-03 DIAGNOSIS — E876 Hypokalemia: Secondary | ICD-10-CM | POA: Diagnosis not present

## 2024-06-03 DIAGNOSIS — E871 Hypo-osmolality and hyponatremia: Secondary | ICD-10-CM | POA: Insufficient documentation

## 2024-06-03 DIAGNOSIS — I10 Essential (primary) hypertension: Secondary | ICD-10-CM | POA: Insufficient documentation

## 2024-06-03 DIAGNOSIS — R195 Other fecal abnormalities: Secondary | ICD-10-CM | POA: Insufficient documentation

## 2024-06-03 DIAGNOSIS — Z800 Family history of malignant neoplasm of digestive organs: Secondary | ICD-10-CM | POA: Insufficient documentation

## 2024-06-03 DIAGNOSIS — M47812 Spondylosis without myelopathy or radiculopathy, cervical region: Secondary | ICD-10-CM | POA: Insufficient documentation

## 2024-06-03 MED ORDER — HYDROCHLOROTHIAZIDE 12.5 MG PO TABS: 12.5000 mg | ORAL_TABLET | Freq: Every day | ORAL | 1 refills | Status: DC

## 2024-06-03 MED ORDER — AMLODIPINE BESYLATE 2.5 MG PO TABS: 2.5000 mg | ORAL_TABLET | Freq: Every day | ORAL | 3 refills | Status: DC

## 2024-06-03 MED ORDER — AMLODIPINE BESYLATE 5 MG PO TABS: 5.0000 mg | ORAL_TABLET | Freq: Every day | ORAL | 1 refills | Status: DC

## 2024-06-03 MED ORDER — HYDROCHLOROTHIAZIDE 25 MG PO TABS: 25.0000 mg | ORAL_TABLET | Freq: Every day | ORAL | 3 refills | Status: DC

## 2024-06-03 NOTE — Assessment & Plan Note (Signed)
 Blood pressure is elevated on initial reading here in the office.  Additionally, she has had various electrolyte abnormalities over the years and on prior labs.  She has primarily dealt with mild hyponatremia and hypokalemia.  We discussed considerations.  Discussed that this is likely related to her hydrochlorothiazide .  As such, would recommend lowering dose of medication and likely discontinue medication. We will reduce dose to 12.5 mg and we will also increase dose of amlodipine to 5 mg in order to try to control blood pressure. We will check labs in 2 to 3 weeks with follow-up appointment in 4 to 6 weeks or sooner as needed.  Recommend intermittent monitoring of blood pressure, DASH diet

## 2024-06-03 NOTE — Progress Notes (Signed)
 New Patient Office Visit  Subjective   Patient ID: Brittany Collier, female    DOB: 05/06/1951  Age: 73 y.o. MRN: [id number]  CC:  Chief Complaint  Patient presents with   Establish Care    HPI [name] presents to establish care Last PCP - Dr. Frederik  HTN: taking amlodipine and hydrochlorothiazide . Has been taking hydrochlorothiazide  forever and started amlodipine 1-2 years ago. BP at home and recently 130s/70s.  Panic disorder: takes alprazolam  twice daily, managed by psychiatry.  Recent ED for weakness, felt that her electrolytes were off.  She was found to have low sodium as well as low potassium.  Patient is originally from [place]. She has lived here since she was 73 years old. She enjoys reading, gardening, quilting, sewing, makes her own soap.  Outpatient Encounter Medications as of 06/03/2024  Medication Sig   ALPRAZolam  (XANAX  XR) 1 MG 24 hr tablet Take 1 mg 2 (two) times daily by mouth. MORNING and EVENING   ALPRAZolam  (XANAX ) 0.5 MG tablet Take 0.5 mg by mouth 2 (two) times daily. MORNING and BEDTIME   Melatonin 5 MG TABS Take 10 mg at bedtime by mouth.   [DISCONTINUED] amLODipine (NORVASC) 2.5 MG tablet Take 2.5 mg by mouth daily.   [DISCONTINUED] hydrochlorothiazide  (HYDRODIURIL ) 25 MG tablet Take 25 mg by mouth daily.   amLODipine (NORVASC) 5 MG tablet Take 1 tablet (5 mg total) by mouth daily.   hydrochlorothiazide  (HYDRODIURIL ) 12.5 MG tablet Take 1 tablet (12.5 mg total) by mouth daily.   [DISCONTINUED] amLODipine (NORVASC) 2.5 MG tablet Take 1 tablet (2.5 mg total) by mouth daily.   [DISCONTINUED] hydrochlorothiazide  (HYDRODIURIL ) 25 MG tablet Take 1 tablet (25 mg total) by mouth daily.   No facility-administered encounter medications on file as of 06/03/2024.    Past Medical History:  Diagnosis Date   Allergy    Anxiety    Arthritis    Cataract    Heart murmur    Hypertension     Past Surgical History:  Procedure Laterality Date    BREAST BIOPSY Right 01/04/2023   US  RT BREAST BX W LOC DEV 1ST LESION IMG BX SPEC US  GUIDE 01/04/2023 [facility]   BREAST BIOPSY  02/03/2023   US  RT RADIOACTIVE SEED LOC 02/03/2023 [facility]   BREAST LUMPECTOMY WITH RADIOACTIVE SEED AND SENTINEL LYMPH NODE BIOPSY Right 02/06/2023   Procedure: RIGHT BREAST LUMPECTOMY WITH RADIOACTIVE SEED AND SENTINEL LYMPH NODE BIOPSY;  Surgeon: Vanderbilt Ned, MD;  Location: [facility];  Service: General;  Laterality: Right;  PEC BLOCK   HAND SURGERY Right    finger release    Family History  Problem Relation Age of Onset   Arthritis Mother    Diabetes Mother    Anxiety disorder Mother    Learning disabilities Mother     Social History   Socioeconomic History   Marital status: Married    Spouse name: Not on file   Number of children: 3   Years of education: Not on file   Highest education level: Master's degree (e.g., MA, MS, MEng, MEd, MSW, MBA)  Occupational History   Not on file  Tobacco Use   Smoking status: Never    Passive exposure: Never   Smokeless tobacco: Never  Vaping Use   Vaping status: Never Used  Substance and Sexual Activity   Alcohol use: Not Currently    Alcohol/week: 1.0 standard drink of alcohol    Types: 1 Glasses of wine per week  Comment: rarely   Drug use: Never   Sexual activity: Yes    Birth control/protection: None  Other Topics Concern   Not on file  Social History Narrative   Not on file   Social Drivers of Health   Financial Resource Strain: Low Risk  (06/02/2024)   Overall Financial Resource Strain (CARDIA)    Difficulty of Paying Living Expenses: Not hard at all  Food Insecurity: No Food Insecurity (06/02/2024)   Hunger Vital Sign    Worried About Running Out of Food in the Last Year: Never true    Ran Out of Food in the Last Year: Never true  Transportation Needs: No Transportation Needs (06/02/2024)   PRAPARE - Administrator, Civil Service (Medical): No     Lack of Transportation (Non-Medical): No  Physical Activity: Sufficiently Active (06/02/2024)   Exercise Vital Sign    Days of Exercise per Week: 4 days    Minutes of Exercise per Session: 150+ min  Stress: No Stress Concern Present (06/02/2024)   [employer] of Occupational Health - Occupational Stress Questionnaire    Feeling of Stress: Only a little  Social Connections: Unknown (06/02/2024)   Social Connection and Isolation Panel    Frequency of Communication with Friends and Family: Once a week    Frequency of Social Gatherings with Friends and Family: Patient declined    Attends Religious Services: Never    Database Administrator or Organizations: Yes    Attends Banker Meetings: Patient declined    Marital Status: Married  Catering Manager Violence: Not At Risk (03/15/2023)   Humiliation, Afraid, Rape, and Kick questionnaire    Fear of Current or Ex-Partner: No    Emotionally Abused: No    Physically Abused: No    Sexually Abused: No    Objective   BP (!) 159/106 (BP Location: Left Arm, Patient Position: Sitting, Cuff Size: Large)   Pulse 60   Temp 98.4 F (36.9 C) (Oral)   Resp 18   SpO2 99%   Physical Exam  73 year old female in no acute distress Cardiovascular exam with regular rate and rhythm Lungs clear to auscultation bilaterally  Assessment & Plan:   Benign hypertension Assessment & Plan: Blood pressure is elevated on initial reading here in the office.  Additionally, she has had various electrolyte abnormalities over the years and on prior labs.  She has primarily dealt with mild hyponatremia and hypokalemia.  We discussed considerations.  Discussed that this is likely related to her hydrochlorothiazide .  As such, would recommend lowering dose of medication and likely discontinue medication. We will reduce dose to 12.5 mg and we will also increase dose of amlodipine to 5 mg in order to try to control blood pressure. We will check labs in 2  to 3 weeks with follow-up appointment in 4 to 6 weeks or sooner as needed.  Recommend intermittent monitoring of blood pressure, DASH diet  Orders: -     hydroCHLOROthiazide ; Take 1 tablet (12.5 mg total) by mouth daily.  Dispense: 90 tablet; Refill: 1 -     amLODIPine Besylate; Take 1 tablet (5 mg total) by mouth daily.  Dispense: 90 tablet; Refill: 1 -     Basic metabolic panel with GFR  Hyponatremia  Hypokalemia  Return in about 4 weeks (around 07/01/2024) for hypertension, med check.   Spent 48 minutes on this patient encounter, including preparation, chart review, face-to-face counseling with patient and coordination of care, and documentation of encounter  ___________________________________________ Givanni Staron de [place], MD, ABFM, CAQSM Primary Care and Sports Medicine [facility]

## 2024-06-17 ENCOUNTER — Ambulatory Visit (HOSPITAL_BASED_OUTPATIENT_CLINIC_OR_DEPARTMENT_OTHER)

## 2024-06-17 LAB — BASIC METABOLIC PANEL WITH GFR
BUN/Creatinine Ratio: 18 (ref 12–28)
BUN: 20 mg/dL (ref 8–27)
CO2: 23 mmol/L (ref 20–29)
Calcium: 10 mg/dL (ref 8.7–10.3)
Chloride: 101 mmol/L (ref 96–106)
Creatinine, Ser: 1.11 mg/dL — ABNORMAL HIGH (ref 0.57–1.00)
Glucose: 93 mg/dL (ref 70–99)
Potassium: 3.8 mmol/L (ref 3.5–5.2)
Sodium: 137 mmol/L (ref 134–144)
eGFR: 52 mL/min/1.73 — ABNORMAL LOW (ref >59)

## 2024-06-18 ENCOUNTER — Ambulatory Visit (HOSPITAL_BASED_OUTPATIENT_CLINIC_OR_DEPARTMENT_OTHER): Payer: Self-pay | Admitting: Family Medicine

## 2024-07-04 ENCOUNTER — Ambulatory Visit (HOSPITAL_BASED_OUTPATIENT_CLINIC_OR_DEPARTMENT_OTHER): Admitting: Family Medicine

## 2024-07-04 ENCOUNTER — Encounter (HOSPITAL_BASED_OUTPATIENT_CLINIC_OR_DEPARTMENT_OTHER): Payer: Self-pay | Admitting: Family Medicine

## 2024-07-04 VITALS — BP 160/90 | HR 55 | Temp 97.8°F | Resp 18

## 2024-07-04 DIAGNOSIS — Z13220 Encounter for screening for lipoid disorders: Secondary | ICD-10-CM

## 2024-07-04 DIAGNOSIS — I10 Essential (primary) hypertension: Secondary | ICD-10-CM

## 2024-07-04 NOTE — Progress Notes (Signed)
    Procedures performed today:    None.  Independent interpretation of notes and tests performed by another provider:   None.  Brief History, Exam, Impression, and Recommendations:    BP (!) 160/90 (BP Location: Left Arm, Patient Position: Sitting, Cuff Size: Large)   Pulse (!) 55   Temp 97.8 F (36.6 C) (Oral)   Resp 18   SpO2 100%   Benign hypertension Assessment & Plan: Blood pressure borderline elevated in office today.  She does have home blood pressure log with her today and home readings have been better controlled.  Readings show systolic blood pressure being in the 130s and diastolic in the 80s. We discussed considerations, given better control on home readings, we can hold off on any further changes with blood pressure medication today. Previously seen electrolyte abnormalities have resolved with lowered dose of hydrochlorothiazide .  Generally, patient has been feeling better with dose changes well. We will plan to follow-up in about 6 weeks or sooner as needed.  We will check labs before next appointment  Orders: -     CBC with Differential/Platelet -     Comprehensive metabolic panel with GFR -     Hemoglobin A1c -     Lipid panel  Screening for lipid disorders -     Lipid panel  Return in about 6 weeks (around 08/15/2024) for hypertension, med check.   ___________________________________________ Brittany Menchaca de [place], MD, ABFM, CAQSM Primary Care and Sports Medicine [facility]

## 2024-07-04 NOTE — Assessment & Plan Note (Signed)
 Blood pressure borderline elevated in office today.  She does have home blood pressure log with her today and home readings have been better controlled.  Readings show systolic blood pressure being in the 130s and diastolic in the 80s. We discussed considerations, given better control on home readings, we can hold off on any further changes with blood pressure medication today. Previously seen electrolyte abnormalities have resolved with lowered dose of hydrochlorothiazide .  Generally, patient has been feeling better with dose changes well. We will plan to follow-up in about 6 weeks or sooner as needed.  We will check labs before next appointment

## 2024-07-05 LAB — COMPREHENSIVE METABOLIC PANEL WITH GFR
ALT: 10 IU/L (ref 0–32)
AST: 19 IU/L (ref 0–40)
Albumin: 4.4 g/dL (ref 3.8–4.8)
Alkaline Phosphatase: 70 IU/L (ref 49–135)
BUN/Creatinine Ratio: 24 (ref 12–28)
BUN: 25 mg/dL (ref 8–27)
Bilirubin Total: 0.7 mg/dL (ref 0.0–1.2)
CO2: 24 mmol/L (ref 20–29)
Calcium: 10.1 mg/dL (ref 8.7–10.3)
Chloride: 102 mmol/L (ref 96–106)
Creatinine, Ser: 1.05 mg/dL — ABNORMAL HIGH (ref 0.57–1.00)
Globulin, Total: 2.5 g/dL (ref 1.5–4.5)
Glucose: 92 mg/dL (ref 70–99)
Potassium: 4.3 mmol/L (ref 3.5–5.2)
Sodium: 139 mmol/L (ref 134–144)
Total Protein: 6.9 g/dL (ref 6.0–8.5)
eGFR: 56 mL/min/1.73 — ABNORMAL LOW (ref >59)

## 2024-07-05 LAB — LIPID PANEL
Chol/HDL Ratio: 3 ratio (ref 0.0–4.4)
Cholesterol, Total: 253 mg/dL — ABNORMAL HIGH (ref 100–199)
HDL: 85 mg/dL (ref >39)
LDL Chol Calc (NIH): 158 mg/dL — ABNORMAL HIGH (ref 0–99)
Triglycerides: 64 mg/dL (ref 0–149)
VLDL Cholesterol Cal: 10 mg/dL (ref 5–40)

## 2024-07-05 LAB — CBC WITH DIFFERENTIAL/PLATELET
Basophils Absolute: 0 x10E3/uL (ref 0.0–0.2)
Basos: 1 %
EOS (ABSOLUTE): 0.1 x10E3/uL (ref 0.0–0.4)
Eos: 2 %
Hematocrit: 40.9 % (ref 34.0–46.6)
Hemoglobin: 13.2 g/dL (ref 11.1–15.9)
Immature Grans (Abs): 0 x10E3/uL (ref 0.0–0.1)
Immature Granulocytes: 0 %
Lymphocytes Absolute: 0.8 x10E3/uL (ref 0.7–3.1)
Lymphs: 29 %
MCH: 30.3 pg (ref 26.6–33.0)
MCHC: 32.3 g/dL (ref 31.5–35.7)
MCV: 94 fL (ref 79–97)
Monocytes Absolute: 0.2 x10E3/uL (ref 0.1–0.9)
Monocytes: 9 %
Neutrophils Absolute: 1.6 x10E3/uL (ref 1.4–7.0)
Neutrophils: 59 %
Platelets: 184 x10E3/uL (ref 150–450)
RBC: 4.36 x10E6/uL (ref 3.77–5.28)
RDW: 13.5 % (ref 11.7–15.4)
WBC: 2.7 x10E3/uL — ABNORMAL LOW (ref 3.4–10.8)

## 2024-07-05 LAB — HEMOGLOBIN A1C
Est. average glucose Bld gHb Est-mCnc: 126 mg/dL
Hgb A1c MFr Bld: 6 % — ABNORMAL HIGH (ref 4.8–5.6)

## 2024-07-10 ENCOUNTER — Ambulatory Visit (HOSPITAL_BASED_OUTPATIENT_CLINIC_OR_DEPARTMENT_OTHER): Payer: Self-pay | Admitting: Family Medicine

## 2024-07-18 ENCOUNTER — Emergency Department (HOSPITAL_COMMUNITY)
Admission: EM | Admit: 2024-07-18 | Discharge: 2024-07-18 | Disposition: A | Discharge status: Home / Self Care | Attending: Emergency Medicine | Admitting: Emergency Medicine

## 2024-07-18 ENCOUNTER — Emergency Department (HOSPITAL_COMMUNITY)

## 2024-07-18 ENCOUNTER — Encounter (HOSPITAL_COMMUNITY): Payer: Self-pay

## 2024-07-18 ENCOUNTER — Other Ambulatory Visit: Payer: Self-pay

## 2024-07-18 ENCOUNTER — Telehealth (HOSPITAL_BASED_OUTPATIENT_CLINIC_OR_DEPARTMENT_OTHER): Payer: Self-pay | Admitting: Family Medicine

## 2024-07-18 DIAGNOSIS — I10 Essential (primary) hypertension: Secondary | ICD-10-CM | POA: Insufficient documentation

## 2024-07-18 DIAGNOSIS — Z79899 Other long term (current) drug therapy: Secondary | ICD-10-CM | POA: Diagnosis not present

## 2024-07-18 DIAGNOSIS — R509 Fever, unspecified: Secondary | ICD-10-CM | POA: Diagnosis present

## 2024-07-18 DIAGNOSIS — U071 COVID-19: Secondary | ICD-10-CM | POA: Insufficient documentation

## 2024-07-18 LAB — CBC WITH DIFFERENTIAL/PLATELET
Abs Immature Granulocytes: 0.01 K/uL (ref 0.00–0.07)
Basophils Absolute: 0 K/uL (ref 0.0–0.1)
Basophils Relative: 1 %
Eosinophils Absolute: 0 K/uL (ref 0.0–0.5)
Eosinophils Relative: 0 %
HCT: 38.8 % (ref 36.0–46.0)
Hemoglobin: 13.1 g/dL (ref 12.0–15.0)
Immature Granulocytes: 0 %
Lymphocytes Relative: 21 %
Lymphs Abs: 0.8 K/uL (ref 0.7–4.0)
MCH: 30.1 pg (ref 26.0–34.0)
MCHC: 33.8 g/dL (ref 30.0–36.0)
MCV: 89.2 fL (ref 80.0–100.0)
Monocytes Absolute: 0.5 K/uL (ref 0.1–1.0)
Monocytes Relative: 13 %
Neutro Abs: 2.4 K/uL (ref 1.7–7.7)
Neutrophils Relative %: 65 %
Platelets: 159 K/uL (ref 150–400)
RBC: 4.35 MIL/uL (ref 3.87–5.11)
RDW: 13.7 % (ref 11.5–15.5)
WBC: 3.7 K/uL — ABNORMAL LOW (ref 4.0–10.5)
nRBC: 0 % (ref 0.0–0.2)

## 2024-07-18 LAB — COMPREHENSIVE METABOLIC PANEL WITH GFR
ALT: 10 U/L (ref 0–44)
AST: 21 U/L (ref 15–41)
Albumin: 4.3 g/dL (ref 3.5–5.0)
Alkaline Phosphatase: 56 U/L (ref 38–126)
Anion gap: 11 (ref 5–15)
BUN: 16 mg/dL (ref 8–23)
CO2: 24 mmol/L (ref 22–32)
Calcium: 10 mg/dL (ref 8.9–10.3)
Chloride: 97 mmol/L — ABNORMAL LOW (ref 98–111)
Creatinine, Ser: 1.03 mg/dL — ABNORMAL HIGH (ref 0.44–1.00)
GFR, Estimated: 57 mL/min — ABNORMAL LOW (ref >60)
Glucose, Bld: 105 mg/dL — ABNORMAL HIGH (ref 70–99)
Potassium: 3.7 mmol/L (ref 3.5–5.1)
Sodium: 131 mmol/L — ABNORMAL LOW (ref 135–145)
Total Bilirubin: 0.9 mg/dL (ref 0.0–1.2)
Total Protein: 7 g/dL (ref 6.5–8.1)

## 2024-07-18 NOTE — ED Triage Notes (Signed)
 Patient reports starting feeling bad yesterday morning with headache chills fever fatigue and dizziness and tested positive yesterday with covid.  Reports having as syncopal episode this morning.  Reports her WBC are low and she is wanting an antiviral.  Asked patient if she had taken tylenol  or motrin and she reported she doesn't take that.

## 2024-07-18 NOTE — Discharge Instructions (Addendum)
 Please see a doctor if you have any worsening of your symptoms, chest pain, shortness of breath.

## 2024-07-18 NOTE — Telephone Encounter (Signed)
 Copied from CRM #[id number]. Topic: Appointments - Appointment Scheduling >> Jul 18, 2024  8:13 AM Olam RAMAN wrote: Patient/patient representative is calling to schedule an appointment. Refer to attachments for appointment information.   Pt stated she has covid, has low white blood count. needs anti viral shot.  CB:  [phone]

## 2024-07-18 NOTE — ED Provider Notes (Signed)
 [place] [facility] Provider Note   CSN: [id number] Arrival date & time: 07/18/24  9144     Patient presents with: Covid Positive and Loss of Consciousness   Brittany Collier is a 72 y.o. female.   Brittany Collier is a 73 year old female who presents to the ED with dizziness, chills, fever, headache, cough, sore throat, nausea, loose stools for 1 day.  Tested positive for COVID endorses sick contacts from Saturday night.  Today when she sat on the edge of her bed, she got very sweaty and felt lightheaded, dizzy and folic she was get a pass out.  Denies palpitations.  Has not been eating or drinking much (yogurt/fruit and about 6 cups of water).  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   Loss of Consciousness Associated symptoms: dizziness, fever, nausea and weakness   Associated symptoms: no chest pain, no seizures, no shortness of breath and no vomiting        Prior to Admission medications  Medication Sig Start Date End Date Taking? Authorizing Provider  ALPRAZolam  (XANAX  XR) 1 MG 24 hr tablet Take 1 mg 2 (two) times daily by mouth. MORNING and EVENING    [provider]  ALPRAZolam  (XANAX ) 0.5 MG tablet Take 0.5 mg by mouth 2 (two) times daily. MORNING and BEDTIME    [provider]  amLODipine  (NORVASC ) 5 MG tablet Take 1 tablet (5 mg total) by mouth daily. 06/03/24   de [place], Raymond J, MD  hydrochlorothiazide  (HYDRODIURIL ) 12.5 MG tablet Take 1 tablet (12.5 mg total) by mouth daily. 06/03/24   de [place], Raymond J, MD  Melatonin 5 MG TABS Take 10 mg at bedtime by mouth.    [provider]    Allergies: Penicillins    Review of Systems  Constitutional:  Positive for chills, fatigue and fever.  HENT:  Positive for sore throat.   Respiratory:  Negative for shortness of breath and wheezing.   Cardiovascular:  Positive for syncope. Negative for chest  pain and leg swelling.  Gastrointestinal:  Positive for diarrhea and nausea. Negative for abdominal pain and vomiting.  Genitourinary:  Negative for dysuria.  Neurological:  Positive for dizziness, weakness and light-headedness. Negative for seizures and numbness.   Updated Vital Signs BP (!) 143/97   Pulse 63   Temp 98.3 F (36.8 C)   Resp 18   Ht 5' 4 (1.626 m)   Wt 68 kg   SpO2 97%   BMI 25.73 kg/m   Physical Exam Constitutional:      General: She is not in acute distress.    Appearance: She is not ill-appearing.  Cardiovascular:     Rate and Rhythm: Normal rate and regular rhythm.     Heart sounds: Normal heart sounds. No murmur heard.    No friction rub. No gallop.  Pulmonary:     Effort: Pulmonary effort is normal.     Breath sounds: No stridor. No wheezing, rhonchi or rales.  Abdominal:     General: Bowel sounds are normal.     Palpations: Abdomen is soft.     Tenderness: There is no abdominal tenderness.  Musculoskeletal:     Right lower leg: No edema.     Left lower leg: No edema.  Skin:    General: Skin is warm and dry.     Comments: Decreased skin turgor  Neurological:  Mental Status: She is alert.     (all labs ordered are listed, but only abnormal results are displayed) Labs Reviewed  COMPREHENSIVE METABOLIC PANEL WITH GFR - Abnormal; Notable for the following components:      Result Value   Sodium 131 (*)    Chloride 97 (*)    Glucose, Bld 105 (*)    Creatinine, Ser 1.03 (*)    GFR, Estimated 57 (*)    All other components within normal limits  CBC WITH DIFFERENTIAL/PLATELET - Abnormal; Notable for the following components:   WBC 3.7 (*)    All other components within normal limits  URINALYSIS, ROUTINE W REFLEX MICROSCOPIC    EKG: EKG Interpretation Date/Time:  Thursday July 18 2024 09:05:41 EST Ventricular Rate:  60 PR Interval:  224 QRS Duration:  76 QT Interval:  408 QTC Calculation: 408 R Axis:   -26  Text  Interpretation: Sinus rhythm with sinus arrhythmia with 1st degree A-V block Nonspecific T wave abnormality Abnormal ECG Confirmed by Garrick [place] [phone]) on 07/18/2024 9:19:58 AM  Radiology: No results found.   Procedures   Medications Ordered in the ED - No data to display                                  Medical Decision Making This patient is a 73 y.o. female who presents to the ED for concern of positive COVID test and syncopal event.  Past Medical History / Co-morbidities / Social History: History of first-degree AV block. Hypertension Anxiety  Physical Exam: Physical exam performed. The pertinent findings include:  Unremarkable physical exam, decree skin turgor.  Lab Tests: I ordered, and personally interpreted labs.  The pertinent results include:   CBC-WBC 3.7. CMP-sodium 131   Imaging Studies: Chest x-ray: Unremarkable  EKG/Cardiac Monitoring:  My attending physician Dr. Garrick viewed and interpreted the EKG which showed an underlying rhythm of: First-degree AV block. I agree with this interpretation.   Medications: No medications were ordered at this time.   MDM: Patient presented with COVID infection symptoms (chills, fever, headache, cough, sore throat, nausea, loose stools).  She has had decreased oral intake.  The syncopal event is likely due to hypovolemia (decreased fluid/meal intake) and unlikely cardiac, seizure, cardiogenic, vasovagal.  EKG did show first-degree AV block, however patient said that she has had this for several years and she possibly had a previous syncopal event more than 10 years ago.  Encouraged increased p.o. intake and symptomatic treatment.  Patient agreeable to the current management.  I discussed this case with my attending physician Dr. Garrick who cosigned this note including patient's presenting symptoms, physical exam, and planned diagnostics and interventions. Attending physician stated agreement with plan or made changes  to plan which were implemented.      Amount and/or Complexity of Data Reviewed Labs: ordered. Radiology: ordered.      Final diagnoses:  None    ED Discharge Orders     None          Sota Hetz, DO 07/18/24 1157

## 2024-07-18 NOTE — Telephone Encounter (Signed)
 Patient is currently at [facility] ED being evaluated.

## 2024-08-08 ENCOUNTER — Telehealth (HOSPITAL_BASED_OUTPATIENT_CLINIC_OR_DEPARTMENT_OTHER): Payer: Self-pay | Admitting: Family Medicine

## 2024-08-08 NOTE — Telephone Encounter (Signed)
 Copied from CRM #[id number]. Topic: Medicare AWV >> Aug 08, 2024 11:31 AM Nathanel DEL wrote: Called LVM 08/08/2024 to sched AWVS. Please schedule AWVS in office.  Nathanel Paschal; Care Guide Ambulatory Clinical Support [facility] Direct Dial: [phone]

## 2024-08-15 ENCOUNTER — Encounter (HOSPITAL_BASED_OUTPATIENT_CLINIC_OR_DEPARTMENT_OTHER): Payer: Self-pay | Admitting: Family Medicine

## 2024-08-15 ENCOUNTER — Ambulatory Visit (HOSPITAL_BASED_OUTPATIENT_CLINIC_OR_DEPARTMENT_OTHER): Admitting: Family Medicine

## 2024-08-15 VITALS — BP 158/94 | HR 57 | Temp 98.3°F | Resp 18

## 2024-08-15 DIAGNOSIS — I10 Essential (primary) hypertension: Secondary | ICD-10-CM | POA: Diagnosis not present

## 2024-08-15 DIAGNOSIS — D72819 Decreased white blood cell count, unspecified: Secondary | ICD-10-CM | POA: Insufficient documentation

## 2024-08-15 MED ORDER — AMLODIPINE BESYLATE 5 MG PO TABS: 5.0000 mg | ORAL_TABLET | Freq: Every day | ORAL | 1 refills | Status: DC

## 2024-08-15 MED ORDER — HYDROCHLOROTHIAZIDE 12.5 MG PO TABS: 12.5000 mg | ORAL_TABLET | Freq: Every day | ORAL | 1 refills | Status: AC

## 2024-08-15 NOTE — Assessment & Plan Note (Signed)
 Blood pressure borderline elevated in office today.  She does have home blood pressure log with her today and home readings have been better controlled.  Home readings show systolic blood pressure being in the 130s and diastolic in the 80s. We discussed considerations, given better control on home readings, we can hold off on any further changes with blood pressure medication today. Previously seen electrolyte abnormalities have resolved with lowered dose of hydrochlorothiazide  - although she did have some slight abnormalities on recent labs when she was seen for COVID recently in the emergency department.  We will check labs today as below.  Further considerations as above related to observed leukopenia recently

## 2024-08-15 NOTE — Progress Notes (Signed)
" ° ° °  Procedures performed today:    None.  Independent interpretation of notes and tests performed by another provider:   None.  Brief History, Exam, Impression, and Recommendations:    BP (!) 158/94 (BP Location: Left Arm, Patient Position: Sitting, Cuff Size: Large)   Pulse (!) 57   Temp 98.3 F (36.8 C) (Oral)   Resp 18   SpO2 100%   Leukopenia, unspecified type Assessment & Plan: Discussed this could be related to rare side effect of amlodipine .  We will proceed with labs today for monitoring.  Has been borderline low on recent labs. If white blood cell count remains low, then likely would stop amlodipine  and switch to alternative antihypertensive such as ACE inhibitor or ARB  Orders: -     CBC with Differential/Platelet  Benign hypertension Assessment & Plan: Blood pressure borderline elevated in office today.  She does have home blood pressure log with her today and home readings have been better controlled.  Home readings show systolic blood pressure being in the 130s and diastolic in the 80s. We discussed considerations, given better control on home readings, we can hold off on any further changes with blood pressure medication today. Previously seen electrolyte abnormalities have resolved with lowered dose of hydrochlorothiazide  - although she did have some slight abnormalities on recent labs when she was seen for COVID recently in the emergency department.  We will check labs today as below.  Further considerations as above related to observed leukopenia recently  Orders: -     amLODIPine  Besylate; Take 1 tablet (5 mg total) by mouth daily.  Dispense: 90 tablet; Refill: 1 -     hydroCHLOROthiazide ; Take 1 tablet (12.5 mg total) by mouth daily.  Dispense: 90 tablet; Refill: 1 -     Basic metabolic panel with GFR  Return in about 3 months (around 11/13/2024) for hypertension.   ___________________________________________ Tyvion Edmondson de [place], MD, ABFM, CAQSM Primary Care  and Sports Medicine [facility] "

## 2024-08-15 NOTE — Assessment & Plan Note (Signed)
 Discussed this could be related to rare side effect of amlodipine .  We will proceed with labs today for monitoring.  Has been borderline low on recent labs. If white blood cell count remains low, then likely would stop amlodipine  and switch to alternative antihypertensive such as ACE inhibitor or ARB

## 2024-08-16 LAB — CBC WITH DIFFERENTIAL/PLATELET
Basophils Absolute: 0 x10E3/uL (ref 0.0–0.2)
Basos: 1 %
EOS (ABSOLUTE): 0.1 x10E3/uL (ref 0.0–0.4)
Eos: 2 %
Hematocrit: 38.2 % (ref 34.0–46.6)
Hemoglobin: 12.4 g/dL (ref 11.1–15.9)
Immature Grans (Abs): 0 x10E3/uL (ref 0.0–0.1)
Immature Granulocytes: 0 %
Lymphocytes Absolute: 0.8 x10E3/uL (ref 0.7–3.1)
Lymphs: 28 %
MCH: 30.1 pg (ref 26.6–33.0)
MCHC: 32.5 g/dL (ref 31.5–35.7)
MCV: 93 fL (ref 79–97)
Monocytes Absolute: 0.2 x10E3/uL (ref 0.1–0.9)
Monocytes: 8 %
Neutrophils Absolute: 1.7 x10E3/uL (ref 1.4–7.0)
Neutrophils: 61 %
Platelets: 156 x10E3/uL (ref 150–450)
RBC: 4.12 x10E6/uL (ref 3.77–5.28)
RDW: 13.9 % (ref 11.7–15.4)
WBC: 2.8 x10E3/uL — ABNORMAL LOW (ref 3.4–10.8)

## 2024-08-16 LAB — BASIC METABOLIC PANEL WITH GFR
BUN/Creatinine Ratio: 24 (ref 12–28)
BUN: 24 mg/dL (ref 8–27)
CO2: 22 mmol/L (ref 20–29)
Calcium: 10.2 mg/dL (ref 8.7–10.3)
Chloride: 102 mmol/L (ref 96–106)
Creatinine, Ser: 1.01 mg/dL — ABNORMAL HIGH (ref 0.57–1.00)
Glucose: 85 mg/dL (ref 70–99)
Potassium: 4.3 mmol/L (ref 3.5–5.2)
Sodium: 140 mmol/L (ref 134–144)
eGFR: 58 mL/min/1.73 — ABNORMAL LOW

## 2024-08-28 NOTE — Progress Notes (Signed)
 Rickiya Picariello                                          MRN: [id number]   08/28/2024   The [facility] Quality Team Specialist reviewed this patient medical record for the purposes of chart review for care gap closure. The following were reviewed: chart review for care gap closure-controlling blood pressure.    [facility] Quality Team

## 2024-09-05 ENCOUNTER — Ambulatory Visit (HOSPITAL_BASED_OUTPATIENT_CLINIC_OR_DEPARTMENT_OTHER): Payer: Self-pay | Admitting: Family Medicine

## 2024-09-05 DIAGNOSIS — I10 Essential (primary) hypertension: Secondary | ICD-10-CM

## 2024-09-05 MED ORDER — LOSARTAN POTASSIUM 25 MG PO TABS: 25.0000 mg | ORAL_TABLET | Freq: Every day | ORAL | 2 refills | Status: AC

## 2024-09-06 ENCOUNTER — Telehealth (HOSPITAL_BASED_OUTPATIENT_CLINIC_OR_DEPARTMENT_OTHER): Payer: Self-pay | Admitting: Family Medicine

## 2024-09-06 NOTE — Telephone Encounter (Signed)
 Copied from CRM [id number]. Topic: Clinical - Lab/Test Results >> Sep 06, 2024  1:38 PM Brittany Collier wrote: Reason for CRM: Relayed Lab Results, said she didn't see it in her MyChart, states she is not going to change her medication, she will continue to take amlodipine  and hydrochlorothiazide . Would like a callback to discuss.   Patient can be reached at [phone]
